# Patient Record
Sex: Female | Born: 1950 | Race: White | Hispanic: No | Marital: Single | Smoking: Never smoker
Health system: Southern US, Community
[De-identification: ages and names within clinical notes are randomized; demographics above are authoritative.]

## PROBLEM LIST (undated history)

## (undated) DIAGNOSIS — I10 Essential (primary) hypertension: Secondary | ICD-10-CM

## (undated) DIAGNOSIS — E669 Obesity, unspecified: Secondary | ICD-10-CM

## (undated) DIAGNOSIS — H18459 Nodular corneal degeneration, unspecified eye: Secondary | ICD-10-CM

## (undated) DIAGNOSIS — E559 Vitamin D deficiency, unspecified: Secondary | ICD-10-CM

## (undated) DIAGNOSIS — C4490 Unspecified malignant neoplasm of skin, unspecified: Secondary | ICD-10-CM

## (undated) DIAGNOSIS — E079 Disorder of thyroid, unspecified: Secondary | ICD-10-CM

## (undated) HISTORY — DX: Essential (primary) hypertension: I10

## (undated) HISTORY — DX: Vitamin D deficiency, unspecified: E55.9

## (undated) HISTORY — DX: Disorder of thyroid, unspecified: E07.9

## (undated) HISTORY — PX: TUBAL LIGATION: SHX77

## (undated) HISTORY — DX: Unspecified malignant neoplasm of skin, unspecified: C44.90

## (undated) HISTORY — PX: COLONOSCOPY: SHX174

## (undated) HISTORY — DX: Obesity, unspecified: E66.9

## (undated) HISTORY — PX: ABDOMINAL HYSTERECTOMY: SHX81

## (undated) HISTORY — PX: BREAST SURGERY: SHX581

## (undated) HISTORY — DX: Nodular corneal degeneration, unspecified eye: H18.459

---
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
## 2008-05-06 ENCOUNTER — Ambulatory Visit: Payer: Self-pay

## 2010-03-23 LAB — CBC
WBC: 7.3
platelet count: 302

## 2010-03-23 LAB — LIPID PANEL
Cholesterol: 209 mg/dL — AB (ref 0–200)
Triglycerides: 111

## 2010-03-23 LAB — COMPREHENSIVE METABOLIC PANEL
ALT: 32 U/L (ref 7–35)
AST: 26 U/L
GGT: 40 U/L (ref 10–52)
Iron: 107
Total Bilirubin: 0.4 mg/dL

## 2010-03-23 LAB — TSH: TSH: 2.59

## 2011-11-18 ENCOUNTER — Ambulatory Visit: Payer: Self-pay | Admitting: Family Medicine

## 2011-12-20 ENCOUNTER — Telehealth: Payer: Self-pay | Admitting: Family Medicine

## 2011-12-20 NOTE — Telephone Encounter (Signed)
Ok thanks 

## 2011-12-20 NOTE — Telephone Encounter (Signed)
Pt was calling concerning a boil on her neck that is over a cyst that was already there. She was wanting to know if she could get in sooner than 12/30/11 because she was extremely concerned about this. I was wondering if this Thursday at 11:15 was ok ??? She was extremely appreciative and thankful for anything we could do.   Thanks Dr. Reece Agar

## 2011-12-22 ENCOUNTER — Ambulatory Visit (INDEPENDENT_AMBULATORY_CARE_PROVIDER_SITE_OTHER): Payer: PRIVATE HEALTH INSURANCE | Admitting: Family Medicine

## 2011-12-22 ENCOUNTER — Encounter: Payer: Self-pay | Admitting: Family Medicine

## 2011-12-22 VITALS — BP 138/90 | HR 80 | Temp 98.3°F | Ht 66.0 in | Wt 193.5 lb

## 2011-12-22 DIAGNOSIS — L723 Sebaceous cyst: Secondary | ICD-10-CM

## 2011-12-22 DIAGNOSIS — L089 Local infection of the skin and subcutaneous tissue, unspecified: Secondary | ICD-10-CM

## 2011-12-22 DIAGNOSIS — E669 Obesity, unspecified: Secondary | ICD-10-CM | POA: Insufficient documentation

## 2011-12-22 DIAGNOSIS — L720 Epidermal cyst: Secondary | ICD-10-CM | POA: Insufficient documentation

## 2011-12-22 NOTE — Assessment & Plan Note (Signed)
Body mass index is 31.23 kg/(m^2).

## 2011-12-22 NOTE — Patient Instructions (Addendum)
Call us with name of prior doctor (2004) so we may get records. Continue warm compresses. Pass by [facility]'s office for referral to ENT. Script for [facility] labs provided today. Good to meet you today, call us with questions. Return next week for physical.

## 2011-12-22 NOTE — Assessment & Plan Note (Signed)
Given location, hesitant to perform I&D to remove cyst, however I do think this may need to be removed. Will refer to ENT for evaluation and benefit of removal of cyst. Continue bactrim, continue warm compresses.

## 2011-12-22 NOTE — Progress Notes (Signed)
Subjective:    Patient ID: Ashley Boyd, female    DOB: [birth year], 61 y.o.   MRN: [id number]  HPI CC: new pt to establsih  Ashley Boyd asked to be seen as a new patient sooner 2/2 recent boil over previous cyst R lateral neck.  Prior saw a doctor at [facility], no recent physician visits since 2004.  Seen at [facility] Saturday evening for boil on right side of neck.  Placed on bactrim DS 1 pill bid.  Did not I&D.  This boil developed over previous small cyst.  Treated at home with warm compresses.  Finally yesterday started draining - yellowish brown drainage.  Redness and pain actually improved.  Sxs going on for 7 days now.  Has dermatologist in [place] who had been following cyst on lateral neck, who was hesitant to cut into cyst given location.  Taking probiotics while on abx.  Preventative: Unsure last CPE (2004) No recent well woman exam.  Does not have OBGYN. Thinks mammo 2008 - normal. Colonoscopy - possibly done 9 yrs ago.  Caffeine: soda 3x/wk Lives with mother Occupation: accounts Environmental health practitioner Edu: HS Activity: no regular activity Diet: good water, fruits/vegetables occasional  Medications and allergies reviewed and updated in chart.  Past histories reviewed and updated if relevant as below. There is no problem list on file for this patient.  Past Medical History  Diagnosis Date  . Obesity    Past Surgical History  Procedure Date  . Tonsillectomy 1964  . Partial hysterectomy 1990    ovaries remain, fibroid tumor  . Tubal ligation     bilateral   History  Substance Use Topics  . Smoking status: Never Smoker   . Smokeless tobacco: Never Used  . Alcohol Use: No   Family History  Problem Relation Age of Onset  . Cancer Father     lung, smoker  . Emphysema Father   . Hypertension Neg Hx   . Diabetes Neg Hx   . Coronary artery disease Neg Hx   . Stroke Neg Hx    No Known Allergies No current outpatient prescriptions on file prior to  visit.     Review of Systems  Constitutional: Negative for fever, chills, activity change, appetite change, fatigue and unexpected weight change.  HENT: Negative for hearing loss and neck pain.   Eyes: Negative for visual disturbance.  Respiratory: Positive for shortness of breath (with walking, wonders if too sedentary ). Negative for cough, chest tightness and wheezing.   Cardiovascular: Positive for leg swelling (mild left, h/o bad veins). Negative for chest pain and palpitations.  Gastrointestinal: Negative for nausea, vomiting, abdominal pain, diarrhea, constipation, blood in stool and abdominal distention.  Genitourinary: Negative for hematuria and difficulty urinating.  Musculoskeletal: Negative for myalgias and arthralgias.  Skin: Negative for rash.  Neurological: Negative for dizziness, seizures, syncope and headaches.  Hematological: Bruises/bleeds easily (not spontaneous).  Psychiatric/Behavioral: Negative for dysphoric mood. The patient is not nervous/anxious.        Objective:   Physical Exam  Nursing note and vitals reviewed. Constitutional: She is oriented to person, place, and time. She appears well-developed and well-nourished. No distress.  HENT:  Head: Normocephalic and atraumatic.  Right Ear: Hearing, tympanic membrane, external ear and ear canal normal.  Left Ear: Hearing, tympanic membrane, external ear and ear canal normal.  Nose: Nose normal.  Mouth/Throat: Oropharynx is clear and moist. No oropharyngeal exudate.  Eyes: Conjunctivae and EOM are normal. Pupils are equal, round, and reactive  to light. No scleral icterus.  Neck: Normal range of motion. Neck supple. Erythema (mild) present.         Right lateral neck with erythematous edematous epidermal cyst with some wall of cyst protruding out. Expression of wound with mild amt serosanguinous drainage.  Cardiovascular: Normal rate, regular rhythm, normal heart sounds and intact distal pulses.   No murmur  heard. Pulses:      Radial pulses are 2+ on the right side, and 2+ on the left side.  Pulmonary/Chest: Effort normal and breath sounds normal. No respiratory distress. She has no wheezes. She has no rales.  Abdominal: Soft. Bowel sounds are normal. She exhibits no distension and no mass. There is no tenderness. There is no rebound and no guarding.  Musculoskeletal: Normal range of motion. She exhibits no edema.  Lymphadenopathy:    She has no cervical adenopathy.  Neurological: She is alert and oriented to person, place, and time.       CN grossly intact, station and gait intact  Skin: Skin is warm and dry. No rash noted.  Psychiatric: She has a normal mood and affect. Her behavior is normal. Judgment and thought content normal.       Assessment & Plan:  Return for CPE at previously scheduled appt 8/30

## 2011-12-27 LAB — CBC: Hemoglobin: 13.4 g/dL (ref 12.0–16.0)

## 2011-12-27 LAB — BASIC METABOLIC PANEL
BUN: 14 mg/dL (ref 4–21)
Glucose: 85
Potassium: 4.7 mmol/L

## 2011-12-27 LAB — LIPID PANEL
Cholesterol, Total: 217
Direct LDL: 149
HDL: 47 mg/dL (ref 35–70)

## 2011-12-27 LAB — TSH: TSH: 7.19

## 2011-12-28 ENCOUNTER — Encounter: Payer: Self-pay | Admitting: Family Medicine

## 2011-12-30 ENCOUNTER — Encounter: Payer: PRIVATE HEALTH INSURANCE | Admitting: Family Medicine

## 2011-12-30 ENCOUNTER — Ambulatory Visit: Payer: Self-pay | Admitting: Family Medicine

## 2011-12-30 ENCOUNTER — Telehealth: Payer: Self-pay | Admitting: Family Medicine

## 2011-12-30 MED ORDER — SULFAMETHOXAZOLE-TMP DS 800-160 MG PO TABS: 1.0000 | ORAL_TABLET | Freq: Two times a day (BID) | ORAL | Status: DC

## 2011-12-30 NOTE — Telephone Encounter (Signed)
Will extend bactrim course another 5 days for total of 15days.  plz notify.  Also, did she see ENT and if so can we get copy of consult? Thanks.

## 2011-12-30 NOTE — Telephone Encounter (Signed)
Pt wants a refill of BACTRIM

## 2011-12-30 NOTE — Telephone Encounter (Signed)
Patient notified. She did see ENT. Records requested.

## 2011-12-30 NOTE — Telephone Encounter (Signed)
Spoke with patient. She is asking for more abx because she feels that wound is not where it should be in the healing process. No fever, but not quite healed yet. She has appt next week with you and would just like an extension on meds.

## 2011-12-30 NOTE — Telephone Encounter (Signed)
Pt would like a copy of recent labs (completed on Tuesday) sent to her.

## 2012-01-02 ENCOUNTER — Encounter: Payer: Self-pay | Admitting: Family Medicine

## 2012-01-02 DIAGNOSIS — E039 Hypothyroidism, unspecified: Secondary | ICD-10-CM | POA: Insufficient documentation

## 2012-01-03 ENCOUNTER — Encounter: Payer: Self-pay | Admitting: Family Medicine

## 2012-01-05 ENCOUNTER — Encounter: Payer: PRIVATE HEALTH INSURANCE | Admitting: Family Medicine

## 2012-01-09 ENCOUNTER — Encounter: Payer: Self-pay | Admitting: Family Medicine

## 2012-01-15 ENCOUNTER — Encounter: Payer: Self-pay | Admitting: Family Medicine

## 2012-01-19 ENCOUNTER — Encounter: Payer: PRIVATE HEALTH INSURANCE | Admitting: Family Medicine

## 2012-02-07 ENCOUNTER — Encounter: Payer: PRIVATE HEALTH INSURANCE | Admitting: Family Medicine

## 2012-04-05 ENCOUNTER — Ambulatory Visit (INDEPENDENT_AMBULATORY_CARE_PROVIDER_SITE_OTHER): Payer: PRIVATE HEALTH INSURANCE | Admitting: Family Medicine

## 2012-04-05 ENCOUNTER — Other Ambulatory Visit: Payer: Self-pay | Admitting: Family Medicine

## 2012-04-05 ENCOUNTER — Telehealth: Payer: Self-pay | Admitting: Family Medicine

## 2012-04-05 ENCOUNTER — Encounter: Payer: Self-pay | Admitting: Family Medicine

## 2012-04-05 VITALS — BP 160/100 | HR 89 | Temp 98.9°F | Wt 192.0 lb

## 2012-04-05 DIAGNOSIS — E039 Hypothyroidism, unspecified: Secondary | ICD-10-CM

## 2012-04-05 DIAGNOSIS — R109 Unspecified abdominal pain: Secondary | ICD-10-CM

## 2012-04-05 DIAGNOSIS — I10 Essential (primary) hypertension: Secondary | ICD-10-CM

## 2012-04-05 LAB — POCT URINALYSIS DIPSTICK
Bilirubin, UA: NEGATIVE
Glucose, UA: NEGATIVE
Leukocytes, UA: NEGATIVE
Nitrite, UA: NEGATIVE
Urobilinogen, UA: 0.2

## 2012-04-05 LAB — COMPREHENSIVE METABOLIC PANEL
ALT: 23 U/L (ref 0–35)
AST: 24 U/L (ref 0–37)
Alkaline Phosphatase: 55 U/L (ref 39–117)
Creatinine, Ser: 0.8 mg/dL (ref 0.4–1.2)
Sodium: 136 mEq/L (ref 135–145)
Total Bilirubin: 0.7 mg/dL (ref 0.3–1.2)
Total Protein: 7.9 g/dL (ref 6.0–8.3)

## 2012-04-05 LAB — POCT UA - MICROSCOPIC ONLY
Bacteria, U Microscopic: 0
WBC, Ur, HPF, POC: 0

## 2012-04-05 LAB — CBC WITH DIFFERENTIAL/PLATELET
Basophils Absolute: 0 10*3/uL (ref 0.0–0.1)
HCT: 45.4 % (ref 36.0–46.0)
Hemoglobin: 14.8 g/dL (ref 12.0–15.0)
Lymphs Abs: 2.6 10*3/uL (ref 0.7–4.0)
MCV: 91 fl (ref 78.0–100.0)
Monocytes Absolute: 1 10*3/uL (ref 0.1–1.0)
Neutro Abs: 11.2 10*3/uL — ABNORMAL HIGH (ref 1.4–7.7)
Platelets: 327 10*3/uL (ref 150.0–400.0)
RDW: 13.9 % (ref 11.5–14.6)

## 2012-04-05 LAB — T4, FREE: Free T4: 0.86 ng/dL (ref 0.60–1.60)

## 2012-04-05 LAB — TSH: TSH: 2.12 u[IU]/mL (ref 0.35–5.50)

## 2012-04-05 MED ORDER — AMOXICILLIN-POT CLAVULANATE 875-125 MG PO TABS: 1.0000 | ORAL_TABLET | Freq: Two times a day (BID) | ORAL | Status: AC

## 2012-04-05 NOTE — Telephone Encounter (Signed)
Patient Information:  Caller Name: Tishana  Phone: [phone]  Patient: Ashley Boyd, Ashley Boyd  Gender: Female  DOB: October 06, 1950  Age: 61 Years  PCP: Eustaquio Boyden [facility])   Symptoms  Reason For Call & Symptoms: Patient states she thinks she might be dehydrated.  "knot in her stomach " (pain) for the past week. Located middle of the abdomen. She states she started herbs three weeksa ago (weight loss- perfect body solution) . She is also under extreme stress at work.  Vomiting  x2  last night .  Diarrhea for x3 days but worse last night. Pain described as cramping. Now headache at temples and complains of her "teeth hurting"  Reviewed Health History In EMR: Yes  Reviewed Medications In EMR: Yes  Reviewed Allergies In EMR: Yes  Reviewed Surgeries / Procedures: No  Date of Onset of Symptoms: 03/29/2012  Treatments Tried: Gatoraide.  Treatments Tried Worked: No  Guideline(s) Used:  Abdominal Pain - Female  Disposition Per Guideline:   See Today in Office  Reason For Disposition Reached:   Moderate or mild pain that comes and goes (cramps) lasts > 24 hours  Advice Given:  Reassurance:  A mild stomachache can be caused by indigestion, gas pains or overeating. Sometimes a stomachache signals the onset of a vomiting illness due to a viral gastroenteritis ("stomach flu").  Here is some care advice that should help.  Rest:  Lie down and rest until you feel better.  Fluids:  Sip clear fluids only (e.g., water, flat soft drinks or 1/2 strength fruit juice) until the pain has been gone for over 2 hours. Then slowly return to a regular diet.  Diet:  Slowly advance diet from clear liquids to a bland diet  Avoid alcohol or caffeinated beverages  Avoid greasy or fatty foods.  Office Follow Up:  Does the office need to follow up with this patient?: Yes  Instructions For The Office: Patient needs to be seen . Appt are book except for 11:30 "physical" with Dr. Reece Agar. please contact  patient. She does not want to be seen at another office. Can she be worked in?  Charity fundraiser Note:  Stop Herb OTC product and bring to the office with you for appt.

## 2012-04-05 NOTE — Assessment & Plan Note (Signed)
Check tsh at [facility].  script provided

## 2012-04-05 NOTE — Assessment & Plan Note (Addendum)
Afebrile.  Hypertensive - see below. Anticipate infectious colitis vs diverticulitis.  Given short duration continue to monitor, discussed bowel rest and pushing small sips throughout the day to ensure staying hydrated. If any worsening, to seek UCC.  If not improving by tomorrow, call me for CT scan to eval for diverticulitis given pain by story localized to LLQ, however on exam bilateral lower quadrants.  Overall benign abdominal exam. Checked UA today.  Sent her to [facility] with script. Pt declines nausea med. UA checked today - +RBC but micro reassuringly normal.  Consider recheck UA at CPE.

## 2012-04-05 NOTE — Patient Instructions (Addendum)
Return in the next month for a complete physical. I anticipate either colitis or diverticulitis.  Rest the bowel and push small sips throughout the day to ensure staying hydrated.  Clear liquid diet for next 1-2 days (chicken broth, gingerale, gatorade, jello). If any worsening, please seek urgent care Call me tomorrow with an update. Get blood work done at [facility] today.

## 2012-04-05 NOTE — Assessment & Plan Note (Signed)
Check Cr, TSH today. Consider EKG next visit. BP Readings from Last 3 Encounters:  04/05/12 160/100  12/22/11 138/90  bp elevation today could be related to abd pain.  Asked her to monitor at home, avoid salt, and will recheck at CPE.

## 2012-04-05 NOTE — Telephone Encounter (Signed)
I called pt and scheduled with Dr Reece Agar today at 11:30 am.

## 2012-04-05 NOTE — Progress Notes (Signed)
  Subjective:    Patient ID: Ashley Boyd, female    DOB: 21-Sep-1950, 61 y.o.   MRN: [id number]  HPI CC: not feeling well.  Never returned to establish or for physical.  1 wk h/o abd pain periumbilical, last night worse pain.  This morning having pain more on LLQ.  + vomiting x2.  Diarrhea last 2 days now resolved.  Headache present wonders if attributable to HTN.  Thinks staying hydrated.  Appetite staying decreased.    No fevers/chills.  No blood in stool.  No dysuria, urgency.  Did go to [place] prior to feeling ill.  No recent travel.  No sick contacts at home. Under a lot of stress at work.  HTN - elevated today.  Denies chest pain or tightness, SOB, leg swelling.  HA started this morning.  Hasn't tried anything for this yet.  Recently had dental work done BP Readings from Last 3 Encounters:  04/05/12 160/100  12/22/11 138/90   For last month has been taking perfect body solutions supplement - bee pollen, chinese yam, barbary wolfberry fruit, lotus seed and fiber and various other herbal remedies.  Thinks had colonoscopy 09-14-2002 ([place]) no records available but was not told had any issues with diverticulosis.  Per prior PCP note, normal colonoscopy  Past Medical History  Diagnosis Date  . Obesity   . Hypothyroidism      Review of Systems Per HPI    Objective:   Physical Exam  Nursing note and vitals reviewed. Constitutional: She appears well-developed and well-nourished. No distress.  HENT:  Head: Normocephalic and atraumatic.  Mouth/Throat: Oropharynx is clear and moist. No oropharyngeal exudate.  Eyes: Conjunctivae normal and EOM are normal. Pupils are equal, round, and reactive to light.  Cardiovascular: Normal rate, regular rhythm, normal heart sounds and intact distal pulses.   No murmur heard. Pulmonary/Chest: Effort normal and breath sounds normal. No respiratory distress. She has no wheezes. She has no rales.  Abdominal: Soft. Bowel sounds  are normal. She exhibits no distension and no mass. There is no hepatosplenomegaly. There is tenderness (jmild to deep palpation) in the right lower quadrant and left lower quadrant. There is no rigidity, no rebound, no guarding, no CVA tenderness and negative Murphy's sign.       Neg psoas test  Musculoskeletal: She exhibits no edema.  Skin: Skin is warm and dry. No rash noted.  Psychiatric: She has a normal mood and affect.       Assessment & Plan:

## 2012-04-06 ENCOUNTER — Telehealth: Payer: Self-pay | Admitting: Family Medicine

## 2012-04-06 NOTE — Telephone Encounter (Signed)
plz mail script to patient.  Placed in Kim's box. We have asked her to hold abx.

## 2012-04-06 NOTE — Telephone Encounter (Signed)
Called the patient to set up CT . She woke up today and has no pain no nausea and no vomiting. She wants to hold off on the CT Scan for now and per Dr G that is ok. She will take the anti biotic for the 10 days and will call to say how she is feeling. She also asked me to ask Dr Reece Agar to please write her a RX with all the labs he wants her to have at [facility] for her CPX here in January. She has to go there to have them drawn. She wanted to remind you to add the liver panel to the RX. Can you mail this to her so she wont have to come by to pick this up.

## 2012-04-06 NOTE — Telephone Encounter (Signed)
Lab order mailed to patient as requested.

## 2012-04-08 ENCOUNTER — Encounter: Payer: Self-pay | Admitting: Family Medicine

## 2012-04-30 LAB — LIPID PANEL
Direct LDL: 169
HDL: 49 mg/dL (ref 35–70)

## 2012-05-03 ENCOUNTER — Encounter: Payer: Self-pay | Admitting: Family Medicine

## 2012-05-07 ENCOUNTER — Ambulatory Visit (INDEPENDENT_AMBULATORY_CARE_PROVIDER_SITE_OTHER): Payer: PRIVATE HEALTH INSURANCE | Admitting: Family Medicine

## 2012-05-07 ENCOUNTER — Encounter: Payer: Self-pay | Admitting: Family Medicine

## 2012-05-07 VITALS — BP 146/86 | HR 86 | Temp 97.7°F | Wt 190.8 lb

## 2012-05-07 DIAGNOSIS — R109 Unspecified abdominal pain: Secondary | ICD-10-CM

## 2012-05-07 DIAGNOSIS — E559 Vitamin D deficiency, unspecified: Secondary | ICD-10-CM | POA: Insufficient documentation

## 2012-05-07 DIAGNOSIS — Z1211 Encounter for screening for malignant neoplasm of colon: Secondary | ICD-10-CM

## 2012-05-07 DIAGNOSIS — I10 Essential (primary) hypertension: Secondary | ICD-10-CM

## 2012-05-07 DIAGNOSIS — E039 Hypothyroidism, unspecified: Secondary | ICD-10-CM

## 2012-05-07 DIAGNOSIS — Z1231 Encounter for screening mammogram for malignant neoplasm of breast: Secondary | ICD-10-CM

## 2012-05-07 DIAGNOSIS — Z0000 Encounter for general adult medical examination without abnormal findings: Secondary | ICD-10-CM | POA: Insufficient documentation

## 2012-05-07 NOTE — Assessment & Plan Note (Signed)
Check vit D at [facility] given endorsed h/o low levels.

## 2012-05-07 NOTE — Assessment & Plan Note (Signed)
Preventative protocols reviewed and updated unless pt declined. Discussed healthy diet and lifestyle.  

## 2012-05-07 NOTE — Assessment & Plan Note (Signed)
Discussed reasons to treat elevated BP.  Discussed dietary changes to help control BP.  Remains hesitant for antihypertensives. BP Readings from Last 3 Encounters:  05/07/12 146/86  04/05/12 160/100  12/22/11 138/90

## 2012-05-07 NOTE — Patient Instructions (Signed)
Call your insurance about the shingles shot to see if it is covered or how much it would cost and where is cheaper (here or pharmacy).  If you want to receive here, call for nurse visit.  Stool kit today. Vit D check at [facility]. Pass by [facility]'s office today to schedule mammogram. Work on more potassium in diet, less sodium (salt) in diet, more water. Return as needed or in 1 year for next physical.

## 2012-05-07 NOTE — Assessment & Plan Note (Signed)
Resolved.  ?viral colitis. Discussed being seen for LLQ pain >2d

## 2012-05-07 NOTE — Progress Notes (Signed)
Subjective:    Patient ID: Ashley Boyd, female    DOB: [birth year], 62 y.o.   MRN: [id number]  HPI CC: annual exam  H/o low vit D - would like checked.  Trying to lose weight.  Using trampoline.  Watching diet. Wt Readings from Last 3 Encounters:  05/07/12 190 lb 12 oz (86.524 kg)  04/05/12 192 lb (87.091 kg)  12/22/11 193 lb 8 oz (87.771 kg)    HTN - brings log of bp showing 140-160s/70-90s.  Trying to watch diet.    Preventative:  Unsure last CPE (2004)  No recent well woman exam. Does not have OBGYN.  H/o hysterectomy for benign reasons.  No need for further pap smear. Thinks mammo 2008 - normal.  Would like [place]. Colonoscopy - 2005 WNL, will do stool kit today. Shingles - will check with insurance. Flu shot - done at [facility] 05/2012. Tetanus - done 2010  Medications and allergies reviewed and updated in chart.  Past histories reviewed and updated if relevant as below. Patient Active Problem List  Diagnosis  . Obesity  . Hypothyroidism  . Abdominal  pain, other specified site  . Hypertension   Past Medical History  Diagnosis Date  . Obesity   . Hypothyroidism   . Vitamin D deficiency    Past Surgical History  Procedure Date  . Tonsillectomy 1964  . Partial hysterectomy 1990    ovaries remain, for fibroid tumor  . Tubal ligation     bilateral  . Colonoscopy 01/2004    WNL per prior PCP note, no report   History  Substance Use Topics  . Smoking status: Never Smoker   . Smokeless tobacco: Never Used  . Alcohol Use: No   Family History  Problem Relation Age of Onset  . Cancer Father     lung, smoker  . Emphysema Father   . Hypertension Neg Hx   . Diabetes Neg Hx   . Coronary artery disease Neg Hx   . Stroke Neg Hx    No Known Allergies No current outpatient prescriptions on file prior to visit.      Review of Systems  Constitutional: Negative for fever, chills, activity change, appetite change, fatigue and unexpected weight change.    HENT: Negative for hearing loss and neck pain.   Eyes: Negative for visual disturbance.  Respiratory: Negative for cough, chest tightness, shortness of breath and wheezing.   Cardiovascular: Negative for chest pain, palpitations and leg swelling.  Gastrointestinal: Negative for nausea, vomiting, diarrhea, constipation, blood in stool and abdominal distention. Abdominal pain: resolved.  Genitourinary: Negative for hematuria and difficulty urinating.  Musculoskeletal: Negative for myalgias and arthralgias.  Skin: Negative for rash.  Neurological: Negative for dizziness, seizures, syncope and headaches.  Hematological: Does not bruise/bleed easily.  Psychiatric/Behavioral: Negative for dysphoric mood. The patient is not nervous/anxious.        Objective:   Physical Exam  Nursing note and vitals reviewed. Constitutional: She is oriented to person, place, and time. She appears well-developed and well-nourished. No distress.  HENT:  Head: Normocephalic and atraumatic.  Right Ear: Hearing, tympanic membrane, external ear and ear canal normal.  Left Ear: Hearing, tympanic membrane, external ear and ear canal normal.  Nose: Nose normal.  Mouth/Throat: Oropharynx is clear and moist. No oropharyngeal exudate.  Eyes: Conjunctivae normal and EOM are normal. Pupils are equal, round, and reactive to light. No scleral icterus.  Neck: Normal range of motion. Neck supple. Carotid bruit is not present. No thyromegaly present.  Cardiovascular: Normal rate, regular rhythm, normal heart sounds and intact distal pulses.   No murmur heard. Pulses:      Radial pulses are 2+ on the right side, and 2+ on the left side.  Pulmonary/Chest: Effort normal and breath sounds normal. No respiratory distress. She has no wheezes. She has no rales. Right breast exhibits no inverted nipple, no mass, no nipple discharge, no skin change and no tenderness. Left breast exhibits no inverted nipple, no mass, no nipple discharge,  no skin change and no tenderness. Breasts are symmetrical.  Abdominal: Soft. Bowel sounds are normal. She exhibits no distension and no mass. There is no tenderness. There is no rebound and no guarding.  Musculoskeletal: Normal range of motion. She exhibits no edema.  Lymphadenopathy:    She has no cervical adenopathy.    She has no axillary adenopathy.       Right axillary: No lateral adenopathy present.       Left axillary: No lateral adenopathy present.      Right: No supraclavicular adenopathy present.       Left: No supraclavicular adenopathy present.  Neurological: She is alert and oriented to person, place, and time.       CN grossly intact, station and gait intact  Skin: Skin is warm and dry. No rash noted.  Psychiatric: She has a normal mood and affect. Her behavior is normal. Judgment and thought content normal.       Assessment & Plan:

## 2012-05-07 NOTE — Assessment & Plan Note (Addendum)
Resolved on recheck. Continue to monitor.  

## 2012-05-08 ENCOUNTER — Other Ambulatory Visit: Payer: Self-pay | Admitting: Family Medicine

## 2012-05-08 ENCOUNTER — Encounter: Payer: Self-pay | Admitting: Family Medicine

## 2012-05-08 MED ORDER — CHOLECALCIFEROL 25 MCG (1000 UT) PO CAPS: 1000.0000 [IU] | ORAL_CAPSULE | Freq: Every day | ORAL | Status: DC

## 2012-05-09 ENCOUNTER — Encounter: Payer: Self-pay | Admitting: *Deleted

## 2012-05-17 ENCOUNTER — Ambulatory Visit: Payer: Self-pay | Admitting: Family Medicine

## 2012-05-17 ENCOUNTER — Encounter: Payer: Self-pay | Admitting: Family Medicine

## 2012-05-18 ENCOUNTER — Ambulatory Visit: Payer: Self-pay | Admitting: Family Medicine

## 2012-05-18 ENCOUNTER — Encounter: Payer: Self-pay | Admitting: Family Medicine

## 2012-05-18 ENCOUNTER — Telehealth: Payer: Self-pay | Admitting: Family Medicine

## 2012-05-18 NOTE — Telephone Encounter (Signed)
Spoke with patient.

## 2012-05-18 NOTE — Telephone Encounter (Signed)
Pt. Left a VM stating she was returning our call.  Please call pt at [phone]

## 2012-05-19 ENCOUNTER — Other Ambulatory Visit: Payer: Self-pay | Admitting: Family Medicine

## 2012-05-19 DIAGNOSIS — R928 Other abnormal and inconclusive findings on diagnostic imaging of breast: Secondary | ICD-10-CM

## 2012-05-22 ENCOUNTER — Ambulatory Visit: Payer: PRIVATE HEALTH INSURANCE

## 2012-05-23 ENCOUNTER — Ambulatory Visit (INDEPENDENT_AMBULATORY_CARE_PROVIDER_SITE_OTHER): Payer: PRIVATE HEALTH INSURANCE | Admitting: *Deleted

## 2012-05-23 DIAGNOSIS — Z23 Encounter for immunization: Secondary | ICD-10-CM

## 2012-05-23 DIAGNOSIS — Z2911 Encounter for prophylactic immunotherapy for respiratory syncytial virus (RSV): Secondary | ICD-10-CM

## 2014-09-25 ENCOUNTER — Telehealth: Payer: Self-pay | Admitting: *Deleted

## 2014-09-25 DIAGNOSIS — E559 Vitamin D deficiency, unspecified: Secondary | ICD-10-CM

## 2014-09-25 DIAGNOSIS — E669 Obesity, unspecified: Secondary | ICD-10-CM

## 2014-09-25 DIAGNOSIS — E039 Hypothyroidism, unspecified: Secondary | ICD-10-CM

## 2014-09-25 DIAGNOSIS — I10 Essential (primary) hypertension: Secondary | ICD-10-CM

## 2014-09-25 NOTE — Telephone Encounter (Signed)
Pt called the Triage line. She has a CPE appointment scheduled with Dr. Reece AgarG on 10/17/14, pt needs labs done at [facility] and is requesting a paper order to take to [facility] to have her labs done before her CPE

## 2014-09-26 NOTE — Telephone Encounter (Signed)
Orders mailed to pt's home address.

## 2014-09-26 NOTE — Telephone Encounter (Signed)
Written and in Kim's box. 

## 2014-09-26 NOTE — Addendum Note (Signed)
Addended by: Eustaquio BoydenGUTIERREZ, Sebrina Kessner on: 09/26/2014 07:23 AM   Modules accepted: Orders

## 2014-09-30 NOTE — Telephone Encounter (Signed)
Pt left v/m requesting status of [facility] order pt requested. Advised pt mailed on 09/26/14 and can take 4-7 days to receive; pt voiced understanding.

## 2014-10-17 ENCOUNTER — Encounter: Payer: PRIVATE HEALTH INSURANCE | Admitting: Family Medicine

## 2016-01-20 ENCOUNTER — Encounter: Payer: Self-pay | Admitting: Emergency Medicine

## 2016-01-20 ENCOUNTER — Emergency Department
Admission: EM | Admit: 2016-01-20 | Discharge: 2016-01-20 | Disposition: A | Discharge status: Home / Self Care | Payer: PRIVATE HEALTH INSURANCE | Attending: Emergency Medicine | Admitting: Emergency Medicine

## 2016-01-20 DIAGNOSIS — I10 Essential (primary) hypertension: Secondary | ICD-10-CM | POA: Insufficient documentation

## 2016-01-20 DIAGNOSIS — E039 Hypothyroidism, unspecified: Secondary | ICD-10-CM | POA: Diagnosis not present

## 2016-01-20 DIAGNOSIS — R51 Headache: Secondary | ICD-10-CM | POA: Diagnosis not present

## 2016-01-20 MED ORDER — AMLODIPINE BESYLATE 2.5 MG PO TABS: 2.5000 mg | ORAL_TABLET | Freq: Every day | ORAL | 0 refills | Status: DC

## 2016-01-20 NOTE — ED Triage Notes (Signed)
Pt presents to ED with reports of headache today and stopped at a pharmacy and took her BP. Pt states was told her systolic BP was 202. Pt was seen at [facility] today and had EKG done. Pt states [facility] told her to come here. Pt denies headache currently. Pt alert and oriented. Pt states she feels shaky on the inside.

## 2016-01-20 NOTE — Discharge Instructions (Signed)
Your blood pressure today is about 160/80. Have your blood pressure rechecked in one week. If it is still elevated above 140 systolic, start the amlodipine as prescribed until you can follow-up with primary care for reassessment.

## 2016-01-20 NOTE — ED Provider Notes (Signed)
[facility] Emergency Department Provider Note  ____________________________________________  Time seen: Approximately 6:31 PM  I have reviewed the triage vital signs and the nursing notes.   HISTORY  Chief Complaint Hypertension    HPI Ashley Boyd is a 65 y.o. female who complains of gradual onset headache that started about noon today. No trauma. No vision changes numbness tingling weakness vomiting or fever or neck stiffness. This was new for her she normally does not get severe headaches. Headache was bilateral frontal and throbbing. She checked her blood pressure and I automated machine and it was about 200 systolic. She went to urgent care who sent her here for evaluation.  No syncope. No chest pain shortness of breath or palpitations.   Past Medical History:  Diagnosis Date  . Hypothyroidism    currently stable off meds  . Obesity   . Vitamin D deficiency      Patient Active Problem List   Diagnosis Date Noted  . Healthcare maintenance 05/07/2012  . Vitamin D deficiency   . Abdominal pain, other specified site 04/05/2012  . Hypertension 04/05/2012  . Hypothyroidism   . Obesity      Past Surgical History:  Procedure Laterality Date  . COLONOSCOPY  01/2004   WNL per prior PCP note, no report  . PARTIAL HYSTERECTOMY  1990   ovaries remain, for fibroid tumor  . TONSILLECTOMY  1964  . TUBAL LIGATION     bilateral     Prior to Admission medications   Medication Sig Start Date End Date Taking? Authorizing Provider  amLODipine (NORVASC) 2.5 MG tablet Take 1 tablet (2.5 mg total) by mouth daily. 01/20/16 01/19/17  Sharman CheekPhillip Pearle Wandler, MD  Cholecalciferol ([employer] VITAMIN D3) 1000 UNITS capsule Take 1 capsule (1,000 Units total) by mouth daily. 05/08/12   Eustaquio BoydenJavier Gutierrez, MD     Allergies Review of patient's allergies indicates no known allergies.   Family History  Problem Relation Age of Onset  . Cancer Father     lung, smoker  .  Emphysema Father   . Hypertension Neg Hx   . Diabetes Neg Hx   . Coronary artery disease Neg Hx   . Stroke Neg Hx     Social History Social History  Substance Use Topics  . Smoking status: Never Smoker  . Smokeless tobacco: Never Used  . Alcohol use No    Review of Systems  Constitutional:   No fever or chills. No recent illness ENT:   No sore throat. No rhinorrhea. Cardiovascular:   No chest pain. Respiratory:   No dyspnea or cough. Gastrointestinal:   Negative for abdominal pain, vomiting and diarrhea.   Neurological:   Positive as above for headaches 10-point ROS otherwise negative.  ____________________________________________   PHYSICAL EXAM:  VITAL SIGNS: ED Triage Vitals  Enc Vitals Group     BP 01/20/16 1743 (!) 165/81     Pulse Rate 01/20/16 1743 93     Resp 01/20/16 1743 18     Temp 01/20/16 1743 98.5 F (36.9 C)     Temp Source 01/20/16 1743 Oral     SpO2 01/20/16 1743 94 %     Weight 01/20/16 1743 190 lb (86.2 kg)     Height 01/20/16 1743 5\' 6"  (1.676 m)     Head Circumference --      Peak Flow --      Pain Score 01/20/16 1744 2     Pain Loc --      Pain  Edu? --      Excl. in GC? --     Vital signs reviewed, nursing assessments reviewed.   Constitutional:   Alert and oriented. Well appearing and in no distress. Eyes:   No scleral icterus. No conjunctival pallor. PERRL. EOMI.  No nystagmus. ENT   Head:   Normocephalic and atraumatic.   Nose:   No congestion/rhinnorhea. No septal hematoma   Mouth/Throat:   MMM, no pharyngeal erythema. No peritonsillar mass.    Neck:   No stridor. No SubQ emphysema. No meningismus. Hematological/Lymphatic/Immunilogical:   No cervical lymphadenopathy. Cardiovascular:   RRR. Symmetric bilateral radial and DP pulses.  No murmurs.  Respiratory:   Normal respiratory effort without tachypnea nor retractions. Breath sounds are clear and equal bilaterally. No wheezes/rales/rhonchi. Gastrointestinal:   Soft  and nontender. Non distended. There is no CVA tenderness.  No rebound, rigidity, or guarding. Genitourinary:   deferred Musculoskeletal:   Nontender with normal range of motion in all extremities. No joint effusions.  No lower extremity tenderness.  No edema. Neurologic:   Normal speech and language.  CN 2-10 normal. Normal finger-nose-finger, no pronator drift Motor grossly intact. No gross focal neurologic deficits are appreciated.  Skin:    Skin is warm, dry and intact. No rash noted.  No petechiae, purpura, or bullae.  ____________________________________________    LABS (pertinent positives/negatives) (all labs ordered are listed, but only abnormal results are displayed) Labs Reviewed - No data to display ____________________________________________   EKG  Interpreted by me  Date: 01/20/2016  Rate: 88  Rhythm: normal sinus rhythm  QRS Axis: normal  Intervals: normal  ST/T Wave abnormalities: normal  Conduction Disutrbances: none  Narrative Interpretation: unremarkable      ____________________________________________    RADIOLOGY    ____________________________________________   PROCEDURES Procedures  ____________________________________________   INITIAL IMPRESSION / ASSESSMENT AND PLAN / ED COURSE  Pertinent labs & imaging results that were available during my care of the patient were reviewed by me and considered in my medical decision making (see chart for details).  Patient very well-appearing no acute distress smiling and conversant. Energetic.Considering the patient's symptoms, medical history, and physical examination today, I have low suspicion for ischemic stroke, intracranial hemorrhage, meningitis, encephalitis, carotid or vertebral dissection, venous sinus thrombosis, MS, intracranial hypertension, glaucoma, CRAO, CRVO, or temporal arteritis.  Patient's blood pressure today is stage II hypertension range. No clear etiology. Does not warrant  immediate antihypertensives. The patient is trying to establish herself with primary care but does not have a first appointment until about one month from now. To help facilitate and streamline her access to care, I have written her prescription for amlodipine today. I told her not to fill it for now, to recheck her blood pressure in one week, and if it remains elevated to 160 systolic then she should fill it and start the medication at that time.     Clinical Course   ____________________________________________   FINAL CLINICAL IMPRESSION(S) / ED DIAGNOSES  Final diagnoses:  Essential hypertension       Portions of this note were generated with dragon dictation software. Dictation errors may occur despite best attempts at proofreading.    Sharman Cheek, MD 01/20/16 [phone]

## 2016-02-08 ENCOUNTER — Ambulatory Visit: Payer: PRIVATE HEALTH INSURANCE | Admitting: Family Medicine

## 2016-02-16 ENCOUNTER — Ambulatory Visit (INDEPENDENT_AMBULATORY_CARE_PROVIDER_SITE_OTHER): Payer: PRIVATE HEALTH INSURANCE | Admitting: Family Medicine

## 2016-02-16 ENCOUNTER — Encounter: Payer: Self-pay | Admitting: Family Medicine

## 2016-02-16 VITALS — BP 158/96 | HR 68 | Temp 98.0°F | Ht 64.5 in | Wt 200.0 lb

## 2016-02-16 DIAGNOSIS — Z0000 Encounter for general adult medical examination without abnormal findings: Secondary | ICD-10-CM | POA: Diagnosis not present

## 2016-02-16 DIAGNOSIS — Z1231 Encounter for screening mammogram for malignant neoplasm of breast: Secondary | ICD-10-CM

## 2016-02-16 DIAGNOSIS — E785 Hyperlipidemia, unspecified: Secondary | ICD-10-CM | POA: Insufficient documentation

## 2016-02-16 DIAGNOSIS — Z1239 Encounter for other screening for malignant neoplasm of breast: Secondary | ICD-10-CM

## 2016-02-16 NOTE — Assessment & Plan Note (Signed)
Pap smear no longer needed. Mammogram ordered. In need of colonoscopy. Patient guarded and does not seem to be interested at this time. Declines tetanus, flu, and other immunizations at this time. Pneumococcal vaccine up-to-date. Screening labs today. BP elevated. Patient also does not want to discuss this and does not medication. Her home BP readings are also uncontrolled. Advised her to keep a long and inform me if her blood pressure is consistently elevated greater than 150/90.

## 2016-02-16 NOTE — Progress Notes (Signed)
Pre visit review using our clinic review tool, if applicable. No additional management support is needed unless otherwise documented below in the visit note. 

## 2016-02-16 NOTE — Patient Instructions (Signed)
Get your mammogram.  Consider Tetanus vaccine, colonoscopy.  Let me know if your BP stays >150/90.  Follow up in 6 months to 1 year   Take care  Dr. Adriana Simas   Health Maintenance, Female Adopting a healthy lifestyle and getting preventive care can go a long way to promote health and wellness. Talk with your health care provider about what schedule of regular examinations is right for you. This is a good chance for you to check in with your provider about disease prevention and staying healthy. In between checkups, there are plenty of things you can do on your own. Experts have done a lot of research about which lifestyle changes and preventive measures are most likely to keep you healthy. Ask your health care provider for more information. WEIGHT AND DIET  Eat a healthy diet  Be sure to include plenty of vegetables, fruits, low-fat dairy products, and lean protein.  Do not eat a lot of foods high in solid fats, added sugars, or salt.  Get regular exercise. This is one of the most important things you can do for your health.  Most adults should exercise for at least 150 minutes each week. The exercise should increase your heart rate and make you sweat (moderate-intensity exercise).  Most adults should also do strengthening exercises at least twice a week. This is in addition to the moderate-intensity exercise.  Maintain a healthy weight  Body mass index (BMI) is a measurement that can be used to identify possible weight problems. It estimates body fat based on height and weight. Your health care provider can help determine your BMI and help you achieve or maintain a healthy weight.  For females 48 years of age and older:   A BMI below 18.5 is considered underweight.  A BMI of 18.5 to 24.9 is normal.  A BMI of 25 to 29.9 is considered overweight.  A BMI of 30 and above is considered obese.  Watch levels of cholesterol and blood lipids  You should start having your blood tested  for lipids and cholesterol at 63 years of age, then have this test every 5 years.  You may need to have your cholesterol levels checked more often if:  Your lipid or cholesterol levels are high.  You are older than 63 years of age.  You are at high risk for heart disease.  CANCER SCREENING   Lung Cancer  Lung cancer screening is recommended for adults 77-49 years old who are at high risk for lung cancer because of a history of smoking.  A yearly low-dose CT scan of the lungs is recommended for people who:  Currently smoke.  Have quit within the past 15 years.  Have at least a 30-pack-year history of smoking. A pack year is smoking an average of one pack of cigarettes a day for 1 year.  Yearly screening should continue until it has been 15 years since you quit.  Yearly screening should stop if you develop a health problem that would prevent you from having lung cancer treatment.  Breast Cancer  Practice breast self-awareness. This means understanding how your breasts normally appear and feel.  It also means doing regular breast self-exams. Let your health care provider know about any changes, no matter how small.  If you are in your 20s or 30s, you should have a clinical breast exam (CBE) by a health care provider every 1-3 years as part of a regular health exam.  If you are 40 or older, have  a CBE every year. Also consider having a breast X-ray (mammogram) every year.  If you have a family history of breast cancer, talk to your health care provider about genetic screening.  If you are at high risk for breast cancer, talk to your health care provider about having an MRI and a mammogram every year.  Breast cancer gene (BRCA) assessment is recommended for women who have family members with BRCA-related cancers. BRCA-related cancers include:  Breast.  Ovarian.  Tubal.  Peritoneal cancers.  Results of the assessment will determine the need for genetic counseling and  BRCA1 and BRCA2 testing. Cervical Cancer Your health care provider may recommend that you be screened regularly for cancer of the pelvic organs (ovaries, uterus, and vagina). This screening involves a pelvic examination, including checking for microscopic changes to the surface of your cervix (Pap test). You may be encouraged to have this screening done every 3 years, beginning at age 29.  For women ages 11-65, health care providers may recommend pelvic exams and Pap testing every 3 years, or they may recommend the Pap and pelvic exam, combined with testing for human papilloma virus (HPV), every 5 years. Some types of HPV increase your risk of cervical cancer. Testing for HPV may also be done on women of any age with unclear Pap test results.  Other health care providers may not recommend any screening for nonpregnant women who are considered low risk for pelvic cancer and who do not have symptoms. Ask your health care provider if a screening pelvic exam is right for you.  If you have had past treatment for cervical cancer or a condition that could lead to cancer, you need Pap tests and screening for cancer for at least 20 years after your treatment. If Pap tests have been discontinued, your risk factors (such as having a new sexual partner) need to be reassessed to determine if screening should resume. Some women have medical problems that increase the chance of getting cervical cancer. In these cases, your health care provider may recommend more frequent screening and Pap tests. Colorectal Cancer  This type of cancer can be detected and often prevented.  Routine colorectal cancer screening usually begins at 63 years of age and continues through 63 years of age.  Your health care provider may recommend screening at an earlier age if you have risk factors for colon cancer.  Your health care provider may also recommend using home test kits to check for hidden blood in the stool.  A small camera at  the end of a tube can be used to examine your colon directly (sigmoidoscopy or colonoscopy). This is done to check for the earliest forms of colorectal cancer.  Routine screening usually begins at age 26.  Direct examination of the colon should be repeated every 5-10 years through 63 years of age. However, you may need to be screened more often if early forms of precancerous polyps or small growths are found. Skin Cancer  Check your skin from head to toe regularly.  Tell your health care provider about any new moles or changes in moles, especially if there is a change in a mole's shape or color.  Also tell your health care provider if you have a mole that is larger than the size of a pencil eraser.  Always use sunscreen. Apply sunscreen liberally and repeatedly throughout the day.  Protect yourself by wearing long sleeves, pants, a wide-brimmed hat, and sunglasses whenever you are outside. HEART DISEASE, DIABETES, AND HIGH  BLOOD PRESSURE   High blood pressure causes heart disease and increases the risk of stroke. High blood pressure is more likely to develop in:  People who have blood pressure in the high end of the normal range (130-139/85-89 mm Hg).  People who are overweight or obese.  People who are African American.  If you are 18-39 years of age, have your blood pressure checked every 3-5 years. If you are 40 years of age or older, have your blood pressure checked every year. You should have your blood pressure measured twice--once when you are at a hospital or clinic, and once when you are not at a hospital or clinic. Record the average of the two measurements. To check your blood pressure when you are not at a hospital or clinic, you can use:  An automated blood pressure machine at a pharmacy.  A home blood pressure monitor.  If you are between 55 years and 79 years old, ask your health care provider if you should take aspirin to prevent strokes.  Have regular diabetes  screenings. This involves taking a blood sample to check your fasting blood sugar level.  If you are at a normal weight and have a low risk for diabetes, have this test once every three years after 63 years of age.  If you are overweight and have a high risk for diabetes, consider being tested at a younger age or more often. PREVENTING INFECTION  Hepatitis B  If you have a higher risk for hepatitis B, you should be screened for this virus. You are considered at high risk for hepatitis B if:  You were born in a country where hepatitis B is common. Ask your health care provider which countries are considered high risk.  Your parents were born in a high-risk country, and you have not been immunized against hepatitis B (hepatitis B vaccine).  You have HIV or AIDS.  You use needles to inject street drugs.  You live with someone who has hepatitis B.  You have had sex with someone who has hepatitis B.  You get hemodialysis treatment.  You take certain medicines for conditions, including cancer, organ transplantation, and autoimmune conditions. Hepatitis C  Blood testing is recommended for:  Everyone born from 1945 through 1965.  Anyone with known risk factors for hepatitis C. Sexually transmitted infections (STIs)  You should be screened for sexually transmitted infections (STIs) including gonorrhea and chlamydia if:  You are sexually active and are younger than 63 years of age.  You are older than 63 years of age and your health care provider tells you that you are at risk for this type of infection.  Your sexual activity has changed since you were last screened and you are at an increased risk for chlamydia or gonorrhea. Ask your health care provider if you are at risk.  If you do not have HIV, but are at risk, it may be recommended that you take a prescription medicine daily to prevent HIV infection. This is called pre-exposure prophylaxis (PrEP). You are considered at risk  if:  You are sexually active and do not regularly use condoms or know the HIV status of your partner(s).  You take drugs by injection.  You are sexually active with a partner who has HIV. Talk with your health care provider about whether you are at high risk of being infected with HIV. If you choose to begin PrEP, you should first be tested for HIV. You should then be tested every   3 months for as long as you are taking PrEP.  PREGNANCY   If you are premenopausal and you may become pregnant, ask your health care provider about preconception counseling.  If you may become pregnant, take 400 to 800 micrograms (mcg) of folic acid every day.  If you want to prevent pregnancy, talk to your health care provider about birth control (contraception). OSTEOPOROSIS AND MENOPAUSE   Osteoporosis is a disease in which the bones lose minerals and strength with aging. This can result in serious bone fractures. Your risk for osteoporosis can be identified using a bone density scan.  If you are 55 years of age or older, or if you are at risk for osteoporosis and fractures, ask your health care provider if you should be screened.  Ask your health care provider whether you should take a calcium or vitamin D supplement to lower your risk for osteoporosis.  Menopause may have certain physical symptoms and risks.  Hormone replacement therapy may reduce some of these symptoms and risks. Talk to your health care provider about whether hormone replacement therapy is right for you.  HOME CARE INSTRUCTIONS   Schedule regular health, dental, and eye exams.  Stay current with your immunizations.   Do not use any tobacco products including cigarettes, chewing tobacco, or electronic cigarettes.  If you are pregnant, do not drink alcohol.  If you are breastfeeding, limit how much and how often you drink alcohol.  Limit alcohol intake to no more than 1 drink per day for nonpregnant women. One drink equals 12  ounces of beer, 5 ounces of wine, or 1 ounces of hard liquor.  Do not use street drugs.  Do not share needles.  Ask your health care provider for help if you need support or information about quitting drugs.  Tell your health care provider if you often feel depressed.  Tell your health care provider if you have ever been abused or do not feel safe at home.   This information is not intended to replace advice given to you by your health care provider. Make sure you discuss any questions you have with your health care provider.   Document Released: 11/01/2010 Document Revised: 05/09/2014 Document Reviewed: 03/20/2013 Elsevier Interactive Patient Education [employer].

## 2016-02-16 NOTE — Progress Notes (Signed)
Subjective:  Patient ID: Ashley Boyd, female    DOB: [birth year]  Age: 65 y.o. MRN: [id number]  CC: Establish care/Annual physical.  HPI Ashley Boyd is a 65 y.o. female presents to the clinic today to establish care. She desires a physical exam.  Preventative Healthcare  Pap smear: No longer indicated (s/p hysterectomy for fibroid uterus).  Mammogram: In need of. Will order.  Colonoscopy: In need of.  Immunizations  Tetanus - Declines.  Pneumococcal - 2017.  Flu - Declines.   Zoster - In need of.  Labs: Screening labs (sending to [facility] due to employer).  Exercise: No regular exercise.  Alcohol use: No.  Smoking/tobacco use: No.   PMH, Surgical Hx, Family Hx, Social History reviewed and updated as below.  Past Medical History:  Diagnosis Date  . Hypertension   . Obesity   . Vitamin D deficiency    Past Surgical History:  Procedure Laterality Date  . ABDOMINAL HYSTERECTOMY    . BREAST SURGERY    . COLONOSCOPY    . TONSILLECTOMY  1964  . TUBAL LIGATION     bilateral   Family History  Problem Relation Age of Onset  . Emphysema Father   . Lung cancer Father   . Hypertension Mother   . Kidney disease Mother   . Diabetes Neg Hx   . Coronary artery disease Neg Hx   . Stroke Neg Hx    Social History  Substance Use Topics  . Smoking status: Never Smoker  . Smokeless tobacco: Never Used  . Alcohol use No    Review of Systems  HENT: Positive for tinnitus.   Psychiatric/Behavioral:       Stress.  All other systems reviewed and are negative.   Objective:   Today's Vitals: BP (!) 158/96 (BP Location: Left Arm, Patient Position: Sitting, Cuff Size: Large)   Pulse 68   Temp 98 F (36.7 C) (Oral)   Ht 5' 4.5" (1.638 m)   Wt 200 lb (90.7 kg)   SpO2 98%   BMI 33.80 kg/m   Physical Exam  Constitutional: She is oriented to person, place, and time. She appears well-developed and well-nourished. No distress.  HENT:  Head:  Normocephalic and atraumatic.  Nose: Nose normal.  Mouth/Throat: Oropharynx is clear and moist. No oropharyngeal exudate.  Normal TM's bilaterally.   Eyes: Conjunctivae are normal. No scleral icterus.  Neck: Neck supple. No thyromegaly present.  Cardiovascular: Normal rate and regular rhythm.   No murmur heard. Pulmonary/Chest: Effort normal and breath sounds normal. She has no wheezes. She has no rales.  Abdominal: Soft. She exhibits no distension. There is no tenderness. There is no rebound and no guarding.  Musculoskeletal: Normal range of motion. She exhibits no edema.  Lymphadenopathy:    She has no cervical adenopathy.  Neurological: She is alert and oriented to person, place, and time.  Skin: Skin is warm and dry. No rash noted.  Psychiatric:  Guarded. Gives little information.  Vitals reviewed.  Assessment & Plan:   Problem List Items Addressed This Visit    Well woman exam without gynecological exam    Pap smear no longer needed. Mammogram ordered. In need of colonoscopy. Patient guarded and does not seem to be interested at this time. Declines tetanus, flu, and other immunizations at this time. Pneumococcal vaccine up-to-date. Screening labs today. BP elevated. Patient also does not want to discuss this and does not medication. Her home BP readings are also uncontrolled. Advised her to  keep a long and inform me if her blood pressure is consistently elevated greater than 150/90.        Other Visit Diagnoses    Screening for breast cancer       Relevant Orders   MM DIGITAL SCREENING BILATERAL      Outpatient Encounter Prescriptions as of 02/16/2016  Medication Sig  . [DISCONTINUED] amLODipine (NORVASC) 2.5 MG tablet Take 1 tablet (2.5 mg total) by mouth daily.  . [DISCONTINUED] Cholecalciferol ([facility] VITAMIN D3) 1000 UNITS capsule Take 1 capsule (1,000 Units total) by mouth daily.   No facility-administered encounter medications on file as of 02/16/2016.      Follow-up: 6 months to 1 year.  Everlene OtherJayce Burlin Mcnair DO [facility] [place]

## 2016-02-18 ENCOUNTER — Encounter: Payer: Self-pay | Admitting: Family Medicine

## 2016-02-18 ENCOUNTER — Telehealth: Payer: Self-pay | Admitting: Family Medicine

## 2016-02-18 NOTE — Addendum Note (Signed)
Addended by: Jennelle HumanNEWTON, ASHLEIGH E on: 02/18/2016 03:19 PM   Modules accepted: Orders

## 2016-02-18 NOTE — Telephone Encounter (Signed)
Ok. Thank you Pt is scheduled.  °

## 2016-02-18 NOTE — Telephone Encounter (Signed)
Ashley Boyd is needing pt to have a diagnostic bilaterl and US of left and right breast. Need order please and Thank you!

## 2016-02-18 NOTE — Telephone Encounter (Signed)
Orders placed.

## 2016-02-19 ENCOUNTER — Telehealth: Payer: Self-pay | Admitting: Family Medicine

## 2016-02-19 NOTE — Telephone Encounter (Signed)
Please advise 

## 2016-02-19 NOTE — Telephone Encounter (Signed)
Pt called wanting to get a colonoscopy done. Referral needed. Pt is requesting first avail or last avail if possible. Thank you!

## 2016-02-20 ENCOUNTER — Other Ambulatory Visit: Payer: Self-pay | Admitting: Family Medicine

## 2016-02-20 DIAGNOSIS — Z1211 Encounter for screening for malignant neoplasm of colon: Secondary | ICD-10-CM

## 2016-03-14 ENCOUNTER — Other Ambulatory Visit: Payer: PRIVATE HEALTH INSURANCE

## 2016-03-14 ENCOUNTER — Ambulatory Visit: Payer: PRIVATE HEALTH INSURANCE

## 2016-03-15 ENCOUNTER — Other Ambulatory Visit: Payer: Self-pay | Admitting: Family Medicine

## 2016-03-16 ENCOUNTER — Other Ambulatory Visit: Payer: PRIVATE HEALTH INSURANCE

## 2016-03-16 ENCOUNTER — Encounter: Payer: Self-pay | Admitting: Family Medicine

## 2016-03-16 ENCOUNTER — Ambulatory Visit: Payer: PRIVATE HEALTH INSURANCE

## 2016-03-16 LAB — LIPID PANEL W/O CHOL/HDL RATIO
CHOLESTEROL TOTAL: 211 mg/dL — AB (ref 100–199)
HDL: 49 mg/dL (ref >39)
LDL Calculated: 137 mg/dL — ABNORMAL HIGH (ref 0–99)
Triglycerides: 127 mg/dL (ref 0–149)
VLDL Cholesterol Cal: 25 mg/dL (ref 5–40)

## 2016-03-16 LAB — COMPREHENSIVE METABOLIC PANEL
A/G RATIO: 1.6 (ref 1.2–2.2)
ALBUMIN: 4.4 g/dL (ref 3.6–4.8)
ALT: 21 IU/L (ref 0–32)
AST: 18 IU/L (ref 0–40)
Alkaline Phosphatase: 53 IU/L (ref 39–117)
BUN / CREAT RATIO: 15 (ref 12–28)
BUN: 15 mg/dL (ref 8–27)
Bilirubin Total: 0.5 mg/dL (ref 0.0–1.2)
CALCIUM: 9.7 mg/dL (ref 8.7–10.3)
CO2: 26 mmol/L (ref 18–29)
CREATININE: 0.97 mg/dL (ref 0.57–1.00)
Chloride: 100 mmol/L (ref 96–106)
GFR calc Af Amer: 72 mL/min/{1.73_m2} (ref >59)
GFR, EST NON AFRICAN AMERICAN: 62 mL/min/{1.73_m2} (ref >59)
GLOBULIN, TOTAL: 2.8 g/dL (ref 1.5–4.5)
Glucose: 97 mg/dL (ref 65–99)
POTASSIUM: 4.7 mmol/L (ref 3.5–5.2)
SODIUM: 142 mmol/L (ref 134–144)
Total Protein: 7.2 g/dL (ref 6.0–8.5)

## 2016-03-16 LAB — VITAMIN D 25 HYDROXY (VIT D DEFICIENCY, FRACTURES): VIT D 25 HYDROXY: 20.9 ng/mL — AB (ref 30.0–100.0)

## 2016-03-16 LAB — CBC
Hematocrit: 40.2 % (ref 34.0–46.6)
Hemoglobin: 13.3 g/dL (ref 11.1–15.9)
MCH: 29.4 pg (ref 26.6–33.0)
MCHC: 33.1 g/dL (ref 31.5–35.7)
MCV: 89 fL (ref 79–97)
PLATELETS: 338 10*3/uL (ref 150–379)
RBC: 4.53 x10E6/uL (ref 3.77–5.28)
RDW: 13.1 % (ref 12.3–15.4)
WBC: 6.2 10*3/uL (ref 3.4–10.8)

## 2016-03-16 LAB — HGB A1C W/O EAG: HEMOGLOBIN A1C: 5.8 % — AB (ref 4.8–5.6)

## 2016-03-17 ENCOUNTER — Encounter: Payer: Self-pay | Admitting: Family Medicine

## 2016-03-18 ENCOUNTER — Ambulatory Visit: Payer: PRIVATE HEALTH INSURANCE

## 2016-03-18 VITALS — Ht 65.0 in | Wt 196.0 lb

## 2016-03-18 DIAGNOSIS — Z0000 Encounter for general adult medical examination without abnormal findings: Secondary | ICD-10-CM

## 2016-03-18 NOTE — Progress Notes (Signed)
Patient comes in requesting weight check and height check per her request.  Patient advised these recording were accurate with what  we had in our records.  Please advise if there will be a charge.

## 2017-04-03 ENCOUNTER — Telehealth: Payer: Self-pay | Admitting: Gastroenterology

## 2017-04-03 NOTE — Telephone Encounter (Signed)
Patient r/c to set up colonoscopy.

## 2017-04-05 ENCOUNTER — Other Ambulatory Visit: Payer: Self-pay

## 2017-04-05 ENCOUNTER — Telehealth: Payer: Self-pay

## 2017-04-05 DIAGNOSIS — Z1211 Encounter for screening for malignant neoplasm of colon: Secondary | ICD-10-CM

## 2017-04-05 NOTE — Telephone Encounter (Signed)
Gastroenterology Pre-Procedure Review  Request Date: 05/09/17 Requesting Physician: Dr. Servando SnareWohl  PATIENT REVIEW QUESTIONS: The patient responded to the following health history questions as indicated:    1. Are you having any GI issues? no 2. Do you have a personal history of Polyps? no 3. Do you have a family history of Colon Cancer or Polyps? no 4. Diabetes Mellitus? no 5. Joint replacements in the past 12 months?no 6. Major health problems in the past 3 months?no 7. Any artificial heart valves, MVP, or defibrillator?no    MEDICATIONS & ALLERGIES:    Patient reports the following regarding taking any anticoagulation/antiplatelet therapy:   Plavix, Coumadin, Eliquis, Xarelto, Lovenox, Pradaxa, Brilinta, or Effient? no Aspirin? no  Patient confirms/reports the following medications:  No current outpatient medications on file.   No current facility-administered medications for this visit.     Patient confirms/reports the following allergies:  No Known Allergies  No orders of the defined types were placed in this encounter.   AUTHORIZATION INFORMATION Primary Insurance: 1D#: Group #:  Secondary Insurance: 1D#: Group #:  SCHEDULE INFORMATION: Date: 05/19/17 Time: Location: [facility]

## 2017-04-27 ENCOUNTER — Telehealth: Payer: Self-pay | Admitting: Gastroenterology

## 2017-04-27 NOTE — Telephone Encounter (Signed)
Patient left a voice message that she needs to reschedule her procedure that is on 1/7. Please call

## 2017-05-12 ENCOUNTER — Telehealth: Payer: Self-pay | Admitting: Gastroenterology

## 2017-05-12 NOTE — Telephone Encounter (Signed)
Patient needs to cancel procedure due to insurance problems. She will call back later to reschedule when her [employer] is in effect.

## 2017-05-16 ENCOUNTER — Encounter: Admission: RE | Payer: Self-pay | Source: Ambulatory Visit

## 2017-05-16 ENCOUNTER — Ambulatory Visit
Admission: RE | Admit: 2017-05-16 | Payer: BLUE CROSS/BLUE SHIELD | Source: Ambulatory Visit | Admitting: Gastroenterology

## 2017-05-16 SURGERY — COLONOSCOPY WITH PROPOFOL
Anesthesia: General

## 2017-10-18 DIAGNOSIS — Z872 Personal history of diseases of the skin and subcutaneous tissue: Secondary | ICD-10-CM | POA: Diagnosis not present

## 2017-10-18 DIAGNOSIS — L570 Actinic keratosis: Secondary | ICD-10-CM | POA: Diagnosis not present

## 2017-10-18 DIAGNOSIS — L818 Other specified disorders of pigmentation: Secondary | ICD-10-CM | POA: Diagnosis not present

## 2017-10-18 DIAGNOSIS — Z85828 Personal history of other malignant neoplasm of skin: Secondary | ICD-10-CM | POA: Diagnosis not present

## 2017-10-18 DIAGNOSIS — L578 Other skin changes due to chronic exposure to nonionizing radiation: Secondary | ICD-10-CM | POA: Diagnosis not present

## 2017-12-06 DIAGNOSIS — L570 Actinic keratosis: Secondary | ICD-10-CM | POA: Diagnosis not present

## 2017-12-11 ENCOUNTER — Other Ambulatory Visit: Payer: Self-pay

## 2017-12-11 DIAGNOSIS — Z1211 Encounter for screening for malignant neoplasm of colon: Secondary | ICD-10-CM

## 2017-12-20 ENCOUNTER — Other Ambulatory Visit: Payer: Self-pay | Admitting: Internal Medicine

## 2017-12-20 DIAGNOSIS — Z1231 Encounter for screening mammogram for malignant neoplasm of breast: Secondary | ICD-10-CM

## 2017-12-21 ENCOUNTER — Ambulatory Visit: Payer: Self-pay | Admitting: Podiatry

## 2017-12-27 ENCOUNTER — Ambulatory Visit: Payer: Medicare Other

## 2017-12-27 ENCOUNTER — Telehealth: Payer: Self-pay | Admitting: *Deleted

## 2017-12-27 ENCOUNTER — Encounter: Payer: Self-pay | Admitting: Podiatry

## 2017-12-27 ENCOUNTER — Ambulatory Visit: Payer: Medicare Other | Admitting: Podiatry

## 2017-12-27 VITALS — BP 127/63 | HR 63 | Resp 16

## 2017-12-27 DIAGNOSIS — M779 Enthesopathy, unspecified: Principal | ICD-10-CM

## 2017-12-27 DIAGNOSIS — M778 Other enthesopathies, not elsewhere classified: Secondary | ICD-10-CM

## 2017-12-27 NOTE — Progress Notes (Signed)
  Subjective:  Patient ID: Ashley Boyd, female    DOB: 1950-09-08,  MRN: [id number] HPI Chief Complaint  Patient presents with  . Foot Pain    Patient requesting a foot exam-wears orthotics made by chiropractor for plantar fasciitis  . New Patient (Initial Visit)    67 y.o. female presents with the above complaint.   ROS: Denies fever chills nausea vomiting muscle aches pains calf pain back pain chest pain shortness of breath.  Past Medical History:  Diagnosis Date  . Hypertension   . Obesity   . Vitamin D deficiency    Past Surgical History:  Procedure Laterality Date  . ABDOMINAL HYSTERECTOMY    . BREAST SURGERY    . COLONOSCOPY    . TONSILLECTOMY  1964  . TUBAL LIGATION     bilateral   No current outpatient medications on file.  No Known Allergies Review of Systems Objective:   Vitals:   12/27/17 1355  BP: 127/63  Pulse: 63  Resp: 16    General: Well developed, nourished, in no acute distress, alert and oriented x3   Dermatological: Skin is warm, dry and supple bilateral. Nails x 10 are well maintained; remaining integument appears unremarkable at this time. There are no open sores, no preulcerative lesions, no rash or signs of infection present.  Vascular: Dorsalis Pedis artery and Posterior Tibial artery pedal pulses are 2/4 bilateral with immedate capillary fill time. Pedal hair growth present. No varicosities and no lower extremity edema present bilateral.   Neruologic: Grossly intact via light touch bilateral. Vibratory intact via tuning fork bilateral. Protective threshold with Semmes Wienstein monofilament intact to all pedal sites bilateral. Patellar and Achilles deep tendon reflexes 2+ bilateral. No Babinski or clonus noted bilateral.   Musculoskeletal: No gross boney pedal deformities bilateral. No pain, crepitus, or limitation noted with foot and ankle range of motion bilateral. Muscular strength 5/5 in all groups tested bilateral.  Mild  tenderness on palpation and range of motion of the second metatarsal phalangeal joint.  Otherwise the foot appears to be rectus and in good position.  Gait: Unassisted, Nonantalgic.    Radiographs:  None taken declined  Assessment & Plan:   Assessment: Capsulitis most likely second metatarsophalangeal joint with some associated neuritis.  Plan: No treatment.  Discussed shoe gear in great detail.     Saud Bail T. ChesterHyatt, [place] Dakota[place]PM

## 2017-12-28 NOTE — Telephone Encounter (Signed)
-----   Message from Gregary CromerVicki L Hill sent at 12/28/2017 12:02 PM EDT ----- No charges are in for xrays so Not sure who needs to get this message for her after visit summary to be corrected

## 2017-12-28 NOTE — Telephone Encounter (Signed)
I informed pt, the accounts receivable agent states there were no imaging charges.

## 2017-12-28 NOTE — Telephone Encounter (Signed)
Pt states she was seen in the [place] office and made it very clear that it was for a routine foot exam, her After Visit Note had that imaging was to be ordered. Pt states no imaging was performed and she wants it billed that way, and there should be no other charge than just her co-pay.

## 2018-01-08 ENCOUNTER — Encounter: Payer: Self-pay | Admitting: *Deleted

## 2018-01-09 ENCOUNTER — Ambulatory Visit: Payer: Medicare Other | Admitting: Anesthesiology

## 2018-01-09 ENCOUNTER — Encounter
Admission: RE | Disposition: A | Discharge status: Home / Self Care | Payer: Self-pay | Source: Ambulatory Visit | Attending: Gastroenterology

## 2018-01-09 ENCOUNTER — Encounter: Payer: Self-pay | Admitting: Anesthesiology

## 2018-01-09 ENCOUNTER — Ambulatory Visit
Admission: RE | Admit: 2018-01-09 | Discharge: 2018-01-09 | Disposition: A | Discharge status: Home / Self Care | Payer: Medicare Other | Source: Ambulatory Visit | Attending: Gastroenterology | Admitting: Gastroenterology

## 2018-01-09 DIAGNOSIS — K5730 Diverticulosis of large intestine without perforation or abscess without bleeding: Secondary | ICD-10-CM | POA: Insufficient documentation

## 2018-01-09 DIAGNOSIS — Z1211 Encounter for screening for malignant neoplasm of colon: Secondary | ICD-10-CM | POA: Diagnosis not present

## 2018-01-09 DIAGNOSIS — E559 Vitamin D deficiency, unspecified: Secondary | ICD-10-CM | POA: Insufficient documentation

## 2018-01-09 DIAGNOSIS — Z79899 Other long term (current) drug therapy: Secondary | ICD-10-CM | POA: Diagnosis not present

## 2018-01-09 DIAGNOSIS — I10 Essential (primary) hypertension: Secondary | ICD-10-CM | POA: Diagnosis not present

## 2018-01-09 HISTORY — PX: COLONOSCOPY WITH PROPOFOL: SHX5780

## 2018-01-09 SURGERY — COLONOSCOPY WITH PROPOFOL
Anesthesia: General

## 2018-01-09 MED ORDER — FENTANYL CITRATE (PF) 100 MCG/2ML IJ SOLN: 25.0000 ug | INTRAMUSCULAR | Status: DC | PRN

## 2018-01-09 MED ORDER — SODIUM CHLORIDE 0.9 % IV SOLN
INTRAVENOUS | Status: DC
  Administered 2018-01-09: 1000 mL via INTRAVENOUS

## 2018-01-09 MED ORDER — ONDANSETRON HCL 4 MG/2ML IJ SOLN: 4.0000 mg | Freq: Once | INTRAMUSCULAR | Status: DC | PRN

## 2018-01-09 MED ORDER — PROPOFOL 500 MG/50ML IV EMUL
INTRAVENOUS | Status: DC | PRN
  Administered 2018-01-09: 130 ug/kg/min via INTRAVENOUS

## 2018-01-09 MED ORDER — LIDOCAINE HCL (PF) 1 % IJ SOLN
INTRAMUSCULAR | Status: AC
  Administered 2018-01-09: 0.3 mL via INTRADERMAL
  Filled 2018-01-09: qty 2

## 2018-01-09 MED ORDER — LIDOCAINE HCL (CARDIAC) PF 100 MG/5ML IV SOSY
PREFILLED_SYRINGE | INTRAVENOUS | Status: DC | PRN
  Administered 2018-01-09: 50 mg via INTRAVENOUS

## 2018-01-09 MED ORDER — PROPOFOL 10 MG/ML IV BOLUS
INTRAVENOUS | Status: DC | PRN
  Administered 2018-01-09: 70 mg via INTRAVENOUS

## 2018-01-09 MED ORDER — LIDOCAINE HCL (PF) 1 % IJ SOLN
2.0000 mL | Freq: Once | INTRAMUSCULAR | Status: AC
  Administered 2018-01-09: 0.3 mL via INTRADERMAL

## 2018-01-09 NOTE — Anesthesia Procedure Notes (Signed)
Performed by: Kearie Mennen, CRNA Pre-anesthesia Checklist: Patient identified, Emergency Drugs available, Suction available, Patient being monitored and Timeout performed Patient Re-evaluated:Patient Re-evaluated prior to induction Oxygen Delivery Method: Nasal cannula Induction Type: IV induction       

## 2018-01-09 NOTE — Anesthesia Preprocedure Evaluation (Addendum)
Anesthesia Evaluation  Patient identified by MRN, date of birth, ID band Patient awake    Reviewed: Allergy & Precautions, NPO status , Patient's Chart, lab work & pertinent test results  Airway Mallampati: III  TM Distance: <3 FB     Dental  (+) Chipped, Caps   Pulmonary neg pulmonary ROS,    Pulmonary exam normal        Cardiovascular hypertension, Normal cardiovascular exam     Neuro/Psych negative neurological ROS  negative psych ROS   GI/Hepatic negative GI ROS, Neg liver ROS,   Endo/Other  negative endocrine ROS  Renal/GU negative Renal ROS  negative genitourinary   Musculoskeletal negative musculoskeletal ROS (+)   Abdominal Normal abdominal exam  (+)   Peds negative pediatric ROS (+)  Hematology negative hematology ROS (+)   Anesthesia Other Findings   Reproductive/Obstetrics                            Anesthesia Physical Anesthesia Plan  ASA: II  Anesthesia Plan: General   Post-op Pain Management:    Induction: Intravenous  PONV Risk Score and Plan: Propofol infusion  Airway Management Planned: Nasal Cannula  Additional Equipment:   Intra-op Plan:   Post-operative Plan:   Informed Consent: I have reviewed the patients History and Physical, chart, labs and discussed the procedure including the risks, benefits and alternatives for the proposed anesthesia with the patient or authorized representative who has indicated his/her understanding and acceptance.   Dental advisory given  Plan Discussed with: CRNA and Surgeon  Anesthesia Plan Comments:         Anesthesia Quick Evaluation

## 2018-01-09 NOTE — Transfer of Care (Signed)
Immediate Anesthesia Transfer of Care Note  Patient: Ashley Boyd  Procedure(s) Performed: COLONOSCOPY WITH PROPOFOL (N/A )  Patient Location: PACU  Anesthesia Type:General  Level of Consciousness: awake and responds to stimulation  Airway & Oxygen Therapy: Patient Spontanous Breathing and Patient connected to nasal cannula oxygen  Post-op Assessment: Report given to RN and Post -op Vital signs reviewed and stable  Post vital signs: Reviewed and stable  Last Vitals:  Vitals Value Taken Time  BP 119/62 01/09/2018 11:07 AM  Temp    Pulse    Resp 13 01/09/2018 11:07 AM  SpO2 100 % 01/09/2018 11:07 AM    Last Pain:  Vitals:   01/09/18 1006  TempSrc: Tympanic  PainSc: 0-No pain         Complications: No apparent anesthesia complications

## 2018-01-09 NOTE — H&P (Signed)
Midge Minium, MD [facility] [address], [address] [place], [place] [address] Phone: [phone] Fax : [phone]  Primary Care Physician:  Corky Downs, MD Primary Gastroenterologist:  Dr. Servando Snare  Pre-Procedure History & Physical: HPI:  Astra Gregg is a 67 y.o. female is here for a screening colonoscopy.   Past Medical History:  Diagnosis Date  . Hypertension   . Obesity   . Vitamin D deficiency     Past Surgical History:  Procedure Laterality Date  . ABDOMINAL HYSTERECTOMY    . BREAST SURGERY    . COLONOSCOPY    . TONSILLECTOMY  1964  . TUBAL LIGATION     bilateral    Prior to Admission medications   Not on File    Allergies as of 12/11/2017  . (No Known Allergies)    Family History  Problem Relation Age of Onset  . Emphysema Father   . Lung cancer Father   . Hypertension Mother   . Kidney disease Mother   . Diabetes Neg Hx   . Coronary artery disease Neg Hx   . Stroke Neg Hx     Social History   Socioeconomic History  . Marital status: Divorced    Spouse name: Not on file  . Number of children: Not on file  . Years of education: Not on file  . Highest education level: Not on file  Occupational History  . Not on file  Social Needs  . Financial resource strain: Not on file  . Food insecurity:    Worry: Not on file    Inability: Not on file  . Transportation needs:    Medical: Not on file    Non-medical: Not on file  Tobacco Use  . Smoking status: Never Smoker  . Smokeless tobacco: Never Used  Substance and Sexual Activity  . Alcohol use: No  . Drug use: No  . Sexual activity: Never  Lifestyle  . Physical activity:    Days per week: Not on file    Minutes per session: Not on file  . Stress: Not on file  Relationships  . Social connections:    Talks on phone: Not on file    Gets together: Not on file    Attends religious service: Not on file    Active member of club or organization: Not on file    Attends meetings of clubs or  organizations: Not on file    Relationship status: Not on file  . Intimate partner violence:    Fear of current or ex partner: Not on file    Emotionally abused: Not on file    Physically abused: Not on file    Forced sexual activity: Not on file  Other Topics Concern  . Not on file  Social History Narrative   Caffeine: soda 3x/wk   Lives with mother, grown son   Occupation: accounts Environmental health practitioner   Edu: HS   Activity: no regular activity   Diet: good water, fruits/vegetables occasional    Review of Systems: See HPI, otherwise negative ROS  Physical Exam: BP (!) 153/69   Pulse (!) 56   Temp (!) 96.9 F (36.1 C) (Tympanic)   Resp 17   Ht 5\' 5"  (1.651 m)   Wt 72.6 kg   SpO2 100%   BMI 26.63 kg/m  General:   Alert,  pleasant and cooperative in NAD Head:  Normocephalic and atraumatic. Neck:  Supple; no masses or thyromegaly. Lungs:  Clear throughout to auscultation.  Heart:  Regular rate and rhythm. Abdomen:  Soft, nontender and nondistended. Normal bowel sounds, without guarding, and without rebound.   Neurologic:  Alert and  oriented x4;  grossly normal neurologically.  Impression/Plan: Icess Leaton is now here to undergo a screening colonoscopy.  Risks, benefits, and alternatives regarding colonoscopy have been reviewed with the patient.  Questions have been answered.  All parties agreeable.

## 2018-01-09 NOTE — Op Note (Signed)
[facility] Patient Name: Ashley Boyd Procedure Date: 01/09/2018 10:49 AM MRN: [id number] Account #: [id number] Date of Birth: 1950-11-25 Admit Type: Outpatient Age: 67 Room: [facility] ENDO ROOM 4 Gender: Female Note Status: Finalized Procedure:            Colonoscopy Indications:          Screening for colorectal malignant neoplasm Providers:            Midge Minium MD, MD Medicines:            Propofol per Anesthesia Complications:        No immediate complications. Procedure:            Pre-Anesthesia Assessment:                       - Prior to the procedure, a History and Physical was                        performed, and patient medications and allergies were                        reviewed. The patient's tolerance of previous                        anesthesia was also reviewed. The risks and benefits of                        the procedure and the sedation options and risks were                        discussed with the patient. All questions were                        answered, and informed consent was obtained. Prior                        Anticoagulants: The patient has taken no previous                        anticoagulant or antiplatelet agents. ASA Grade                        Assessment: II - A patient with mild systemic disease.                        After reviewing the risks and benefits, the patient was                        deemed in satisfactory condition to undergo the                        procedure.                       After obtaining informed consent, the colonoscope was                        passed under direct vision. Throughout the procedure,                        the patient's blood pressure,  pulse, and oxygen                        saturations were monitored continuously. The                        Colonoscope was introduced through the anus and                        advanced to the the cecum, identified by  appendiceal                        orifice and ileocecal valve. The colonoscopy was                        performed without difficulty. The patient tolerated the                        procedure well. The quality of the bowel preparation                        was excellent. Findings:      The perianal and digital rectal examinations were normal.      Multiple small-mouthed diverticula were found in the sigmoid colon. Impression:           - Diverticulosis in the sigmoid colon.                       - No specimens collected. Recommendation:       - Discharge patient to home.                       - Resume previous diet.                       - Continue present medications.                       - Repeat colonoscopy in 10 years for screening unless                        any change in family history or lower GI problems. Procedure Code(s):    --- Professional ---                       [phone], Colonoscopy, flexible; diagnostic, including                        collection of specimen(s) by brushing or washing, when                        performed (separate procedure) Diagnosis Code(s):    --- Professional ---                       Z12.11, Encounter for screening for malignant neoplasm                        of colon CPT copyright 2017 American Medical Association. All rights reserved. The codes documented in this report are preliminary and upon coder review may  be revised to meet current compliance requirements. Midge Minium MD, MD 01/09/2018 11:04:40 AM This report  has been signed electronically. Number of Addenda: 0 Note Initiated On: 01/09/2018 10:49 AM Scope Withdrawal Time: 0 hours 6 minutes 46 seconds  Total Procedure Duration: 0 hours 9 minutes 20 seconds       [facility]

## 2018-01-09 NOTE — Anesthesia Post-op Follow-up Note (Signed)
Anesthesia QCDR form completed.        

## 2018-01-10 ENCOUNTER — Telehealth: Payer: Self-pay | Admitting: Gastroenterology

## 2018-01-10 ENCOUNTER — Encounter: Payer: Self-pay | Admitting: Gastroenterology

## 2018-01-10 NOTE — Telephone Encounter (Signed)
Pt left vm she had a procedure with Dr. Servando Snare yesterday and is experiencing lower back pain and would like to know if that is normal

## 2018-01-10 NOTE — Anesthesia Postprocedure Evaluation (Signed)
Anesthesia Post Note  Patient: Ashley Boyd  Procedure(s) Performed: COLONOSCOPY WITH PROPOFOL (N/A )  Patient location during evaluation: PACU Anesthesia Type: General Level of consciousness: awake and alert and oriented Pain management: pain level controlled Vital Signs Assessment: post-procedure vital signs reviewed and stable Respiratory status: spontaneous breathing Cardiovascular status: blood pressure returned to baseline Anesthetic complications: no     Last Vitals:  Vitals:   01/09/18 1126 01/09/18 1136  BP: (!) 141/85 (!) 164/79  Pulse: (!) 56 (!) 50  Resp: 19 20  Temp:    SpO2: 100% 100%    Last Pain:  Vitals:   01/09/18 1126  TempSrc:   PainSc: 0-No pain                 Ardis Fullwood

## 2018-01-16 ENCOUNTER — Ambulatory Visit
Admission: RE | Admit: 2018-01-16 | Discharge: 2018-01-16 | Disposition: A | Discharge status: Home / Self Care | Payer: Medicare Other | Source: Ambulatory Visit | Attending: Internal Medicine | Admitting: Internal Medicine

## 2018-01-16 DIAGNOSIS — Z1231 Encounter for screening mammogram for malignant neoplasm of breast: Secondary | ICD-10-CM | POA: Diagnosis not present

## 2018-01-18 DIAGNOSIS — Z0000 Encounter for general adult medical examination without abnormal findings: Secondary | ICD-10-CM | POA: Diagnosis not present

## 2018-01-19 DIAGNOSIS — R5381 Other malaise: Secondary | ICD-10-CM | POA: Diagnosis not present

## 2018-01-19 DIAGNOSIS — E7849 Other hyperlipidemia: Secondary | ICD-10-CM | POA: Diagnosis not present

## 2018-01-19 DIAGNOSIS — I10 Essential (primary) hypertension: Secondary | ICD-10-CM | POA: Diagnosis not present

## 2018-01-19 DIAGNOSIS — E034 Atrophy of thyroid (acquired): Secondary | ICD-10-CM | POA: Diagnosis not present

## 2018-01-22 DIAGNOSIS — L820 Inflamed seborrheic keratosis: Secondary | ICD-10-CM | POA: Diagnosis not present

## 2018-04-23 DIAGNOSIS — H18821 Corneal disorder due to contact lens, right eye: Secondary | ICD-10-CM | POA: Diagnosis not present

## 2018-12-03 ENCOUNTER — Other Ambulatory Visit: Payer: Self-pay

## 2018-12-03 ENCOUNTER — Encounter: Payer: Self-pay | Admitting: Podiatry

## 2018-12-03 ENCOUNTER — Ambulatory Visit (INDEPENDENT_AMBULATORY_CARE_PROVIDER_SITE_OTHER): Payer: Medicare Other

## 2018-12-03 ENCOUNTER — Ambulatory Visit: Payer: Medicare Other | Admitting: Podiatry

## 2018-12-03 VITALS — Temp 97.3°F

## 2018-12-03 DIAGNOSIS — M778 Other enthesopathies, not elsewhere classified: Secondary | ICD-10-CM

## 2018-12-03 DIAGNOSIS — M779 Enthesopathy, unspecified: Secondary | ICD-10-CM

## 2018-12-03 NOTE — Progress Notes (Signed)
She presents today chief complaint of painful forefoot bilaterally she states is been bothering her for the past 2 to 3 weeks states that it may have happened when she was doing yard work with her toes bent back for an extended period of time.  She denies any trauma.  She states that massaging and soaking has not really helped at all.  She does states they are approximately 50% better than when she initially called the office to make the appointment.  Objective: Vital signs are stable she is alert oriented x3 pulses are strongly palpable.  No open lesions or wounds.  She has medial drift of the second toe left foot at the metatarsal phalangeal joint right foot does not appear to be affected.  She still has pain on palpation and end range of motion of the second metatarsal phalangeal joints bilaterally.  Radiographs taken today demonstrate medial deviation of the second toe otherwise and elongated plantarflexed second metatarsal is noted bilaterally.  Assessment: Plantar flexed second metatarsal with capsulitis bilaterally with medial deviation of the second toe left.  No bunion deformity.  Plan: Discussed the need for oral anti-inflammatories topical anti-inflammatories as well as injectable anti-inflammatories she declines at this time we did discussed appropriate shoe gear stretching exercise ice therapy and shoe gear modifications.  I will follow-up with her in 3 weeks and if she is not improved dramatically we will inject her second interdigital space.

## 2018-12-06 ENCOUNTER — Telehealth: Payer: Self-pay

## 2018-12-06 MED ORDER — METHYLPREDNISOLONE 4 MG PO TABS: ORAL_TABLET | ORAL | 0 refills | Status: DC

## 2018-12-06 NOTE — Telephone Encounter (Signed)
Medication has been sent to pharmacy per Dr. Stephenie Acres verbal order

## 2018-12-06 NOTE — Telephone Encounter (Signed)
-----   Message from Arlyce Dice sent at 12/06/2018  1:53 PM EDT ----- Patient called in wanting to have "Medrol Dose Pack" prescription filled, and sent to [facility] listed on File.

## 2018-12-31 ENCOUNTER — Ambulatory Visit: Payer: Medicare Other | Admitting: Podiatry

## 2019-02-07 DIAGNOSIS — Z85828 Personal history of other malignant neoplasm of skin: Secondary | ICD-10-CM | POA: Diagnosis not present

## 2019-02-07 DIAGNOSIS — L578 Other skin changes due to chronic exposure to nonionizing radiation: Secondary | ICD-10-CM | POA: Diagnosis not present

## 2019-02-07 DIAGNOSIS — Z872 Personal history of diseases of the skin and subcutaneous tissue: Secondary | ICD-10-CM | POA: Diagnosis not present

## 2019-02-07 DIAGNOSIS — L821 Other seborrheic keratosis: Secondary | ICD-10-CM | POA: Diagnosis not present

## 2019-02-11 ENCOUNTER — Telehealth: Payer: Self-pay | Admitting: Podiatry

## 2019-02-13 NOTE — Telephone Encounter (Signed)
I would like my x-rays from my visit on 12/03/2018. I did not see a release form online but if you can, please e-mail the form to me at [email or web address] If you need to speak to me, I can be reached at [phone].

## 2019-08-12 ENCOUNTER — Other Ambulatory Visit: Payer: Self-pay | Admitting: Internal Medicine

## 2019-08-12 DIAGNOSIS — Z1231 Encounter for screening mammogram for malignant neoplasm of breast: Secondary | ICD-10-CM

## 2019-08-21 IMAGING — MG DIGITAL SCREENING BILATERAL MAMMOGRAM WITH TOMO AND CAD
8 series · 9 of 24 positions shown · non-contrast
Comparison: Previous exam(s).

CLINICAL DATA: Screening.

EXAM:
DIGITAL SCREENING BILATERAL MAMMOGRAM WITH TOMO AND CAD

[R MLO synth-2D]
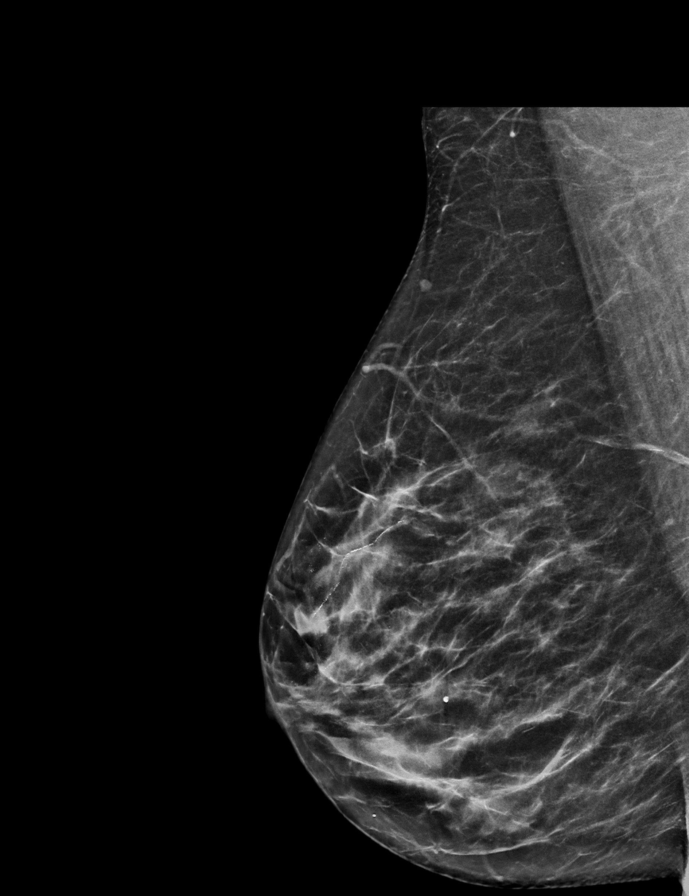

[L MLO synth-2D]
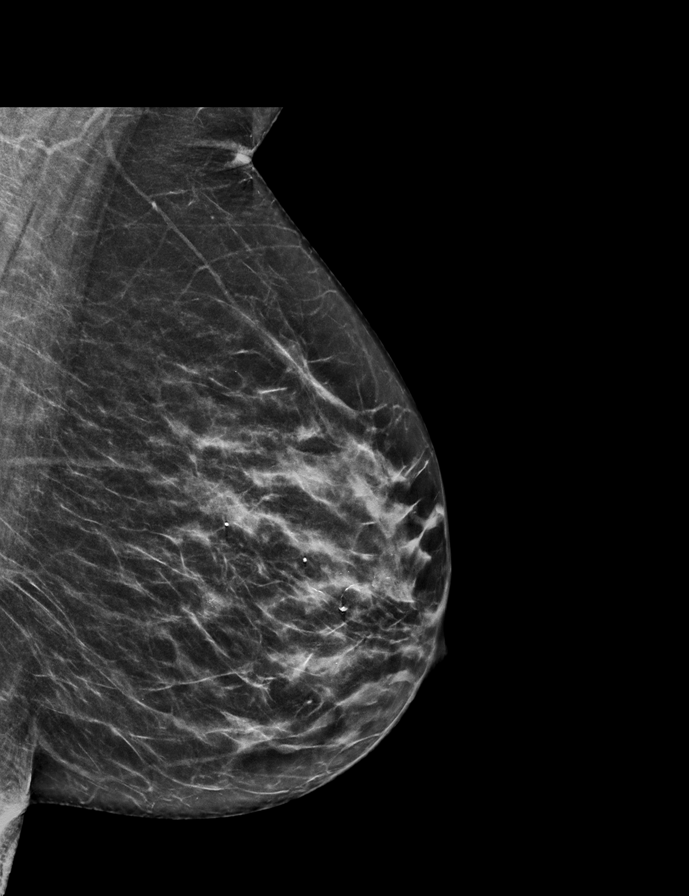

[R CC synth-2D]
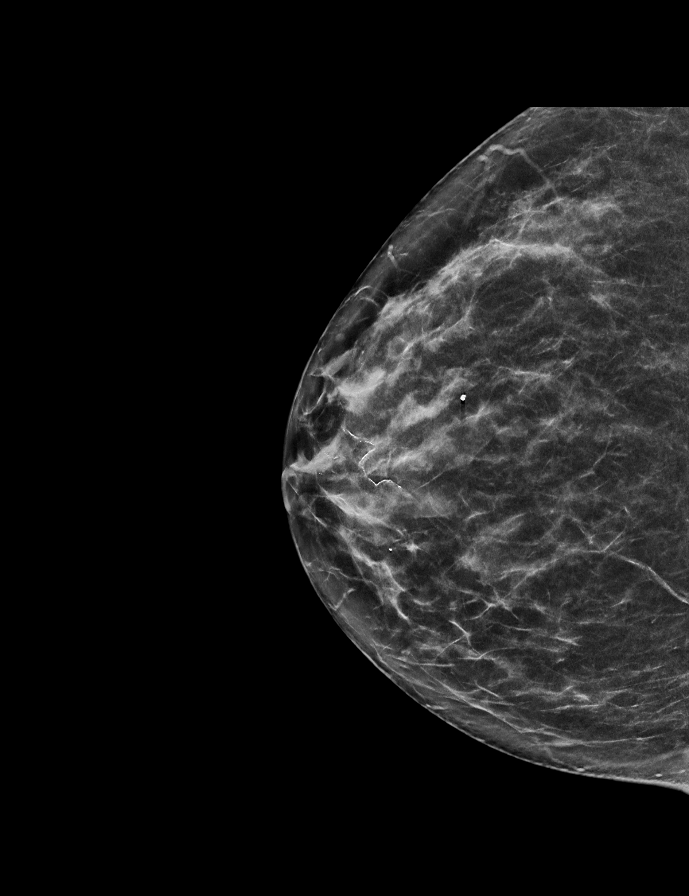

[L CC synth-2D]
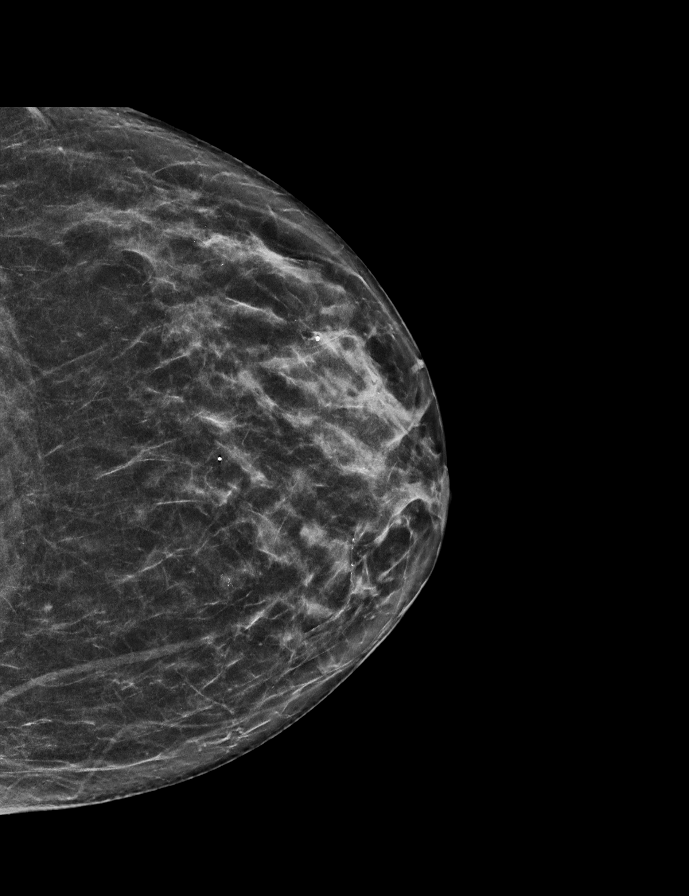

[R MLO tomo · 2 of 63 frames shown]
[frame 21/63]
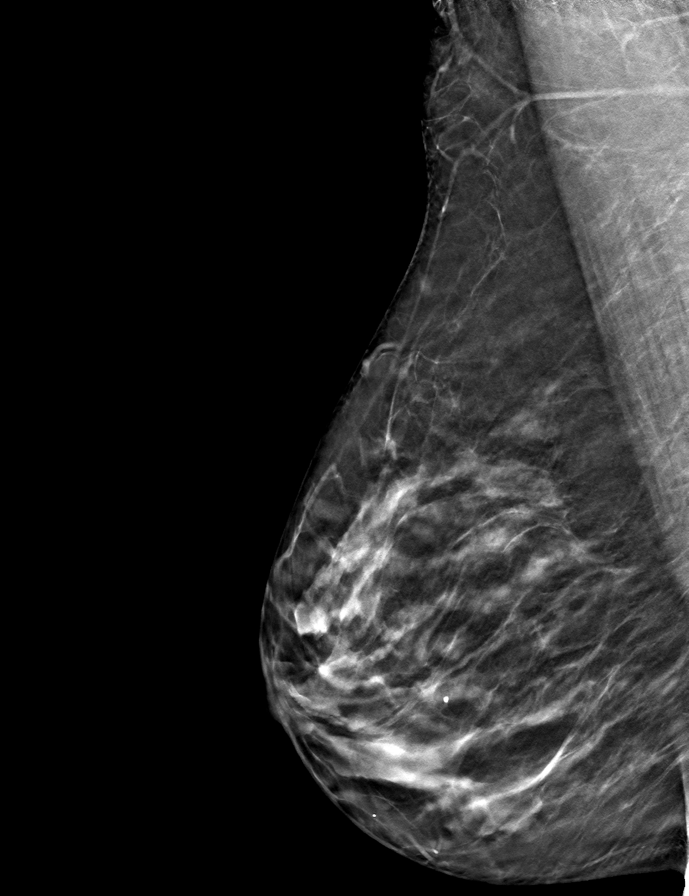
[frame 32/63]
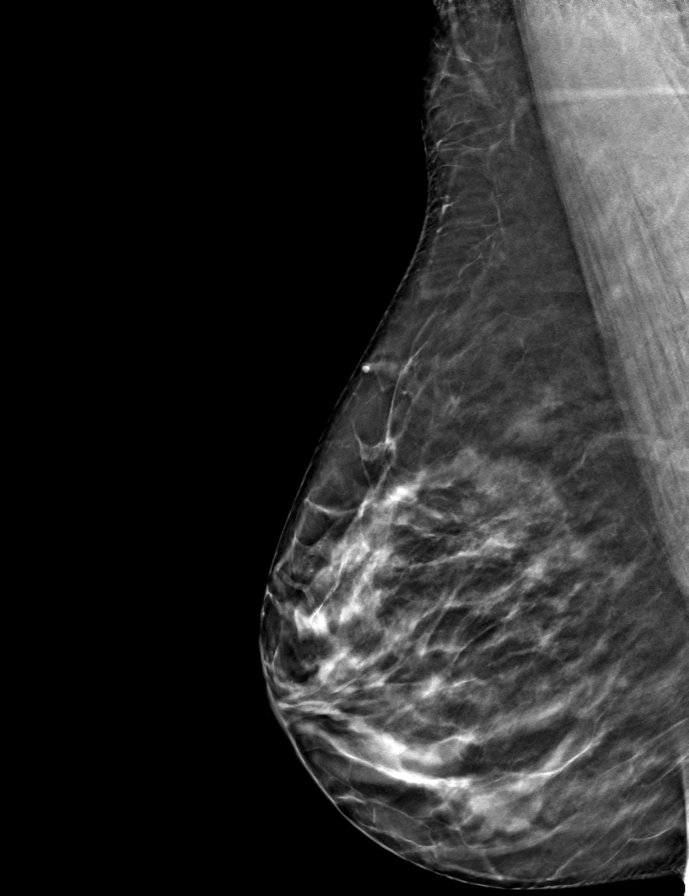

[L MLO tomo · tomo slice 30/59.0]
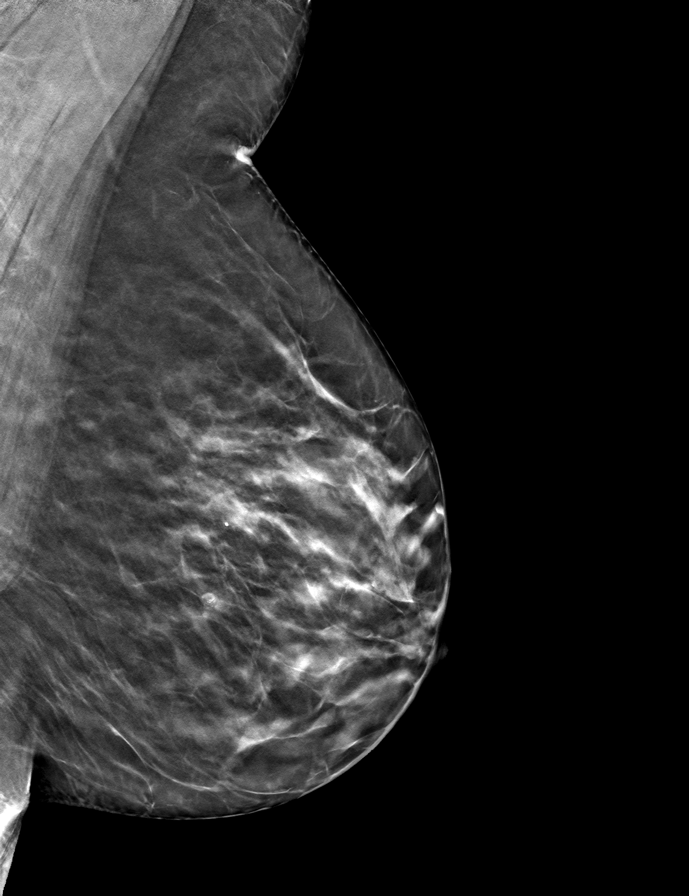

[R CC tomo · tomo slice 31/62.0]
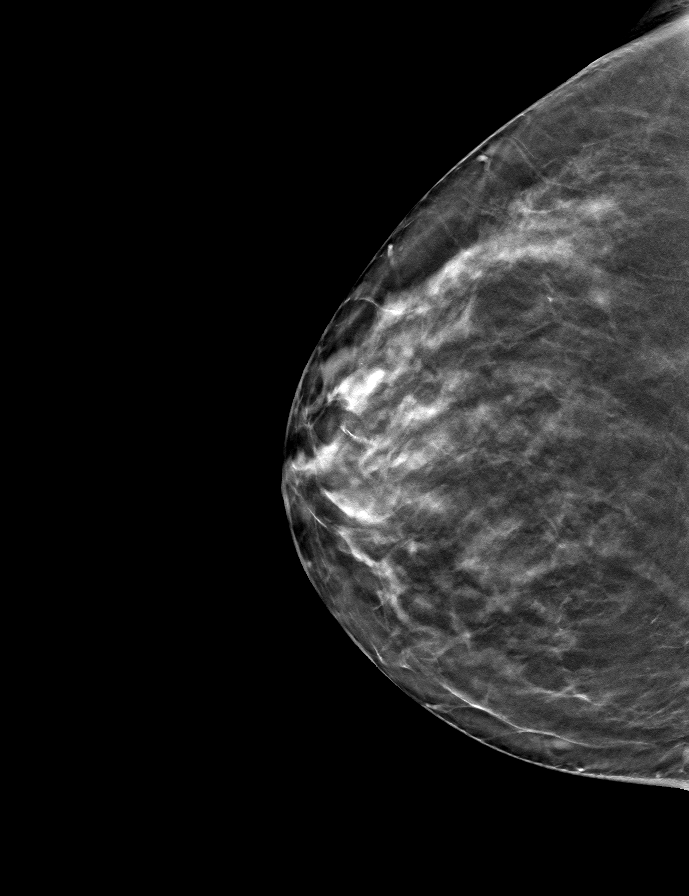

[L CC tomo · tomo slice 29/57.0]
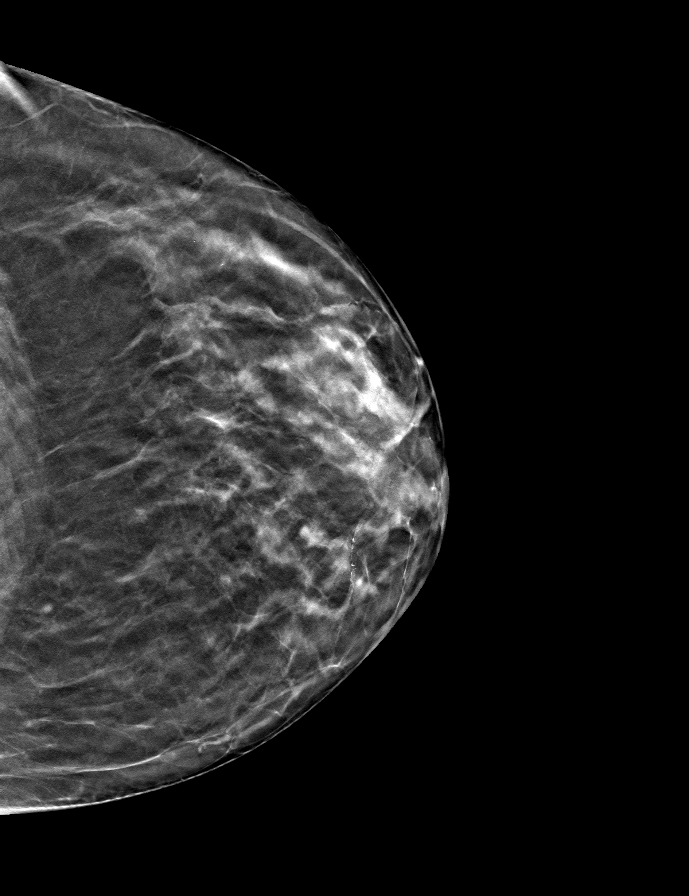

[9 of 24 positions shown; findings below may reference images not displayed]

ACR Breast Density Category c: The breast tissue is heterogeneously
dense, which may obscure small masses.
FINDINGS: There are no findings suspicious for malignancy. Images were
processed with CAD.
IMPRESSION: No mammographic evidence of malignancy. A result letter of this
screening mammogram will be mailed directly to the patient.

RECOMMENDATION:
Screening mammogram in one year. (Code:[id number]-[id number])

BI-RADS CATEGORY  1: Negative.

## 2019-12-18 DIAGNOSIS — M9903 Segmental and somatic dysfunction of lumbar region: Secondary | ICD-10-CM | POA: Diagnosis not present

## 2019-12-18 DIAGNOSIS — M9901 Segmental and somatic dysfunction of cervical region: Secondary | ICD-10-CM | POA: Diagnosis not present

## 2019-12-18 DIAGNOSIS — M4603 Spinal enthesopathy, cervicothoracic region: Secondary | ICD-10-CM | POA: Diagnosis not present

## 2019-12-18 DIAGNOSIS — M9904 Segmental and somatic dysfunction of sacral region: Secondary | ICD-10-CM | POA: Diagnosis not present

## 2019-12-18 DIAGNOSIS — M9902 Segmental and somatic dysfunction of thoracic region: Secondary | ICD-10-CM | POA: Diagnosis not present

## 2019-12-19 DIAGNOSIS — M4603 Spinal enthesopathy, cervicothoracic region: Secondary | ICD-10-CM | POA: Diagnosis not present

## 2019-12-19 DIAGNOSIS — M9903 Segmental and somatic dysfunction of lumbar region: Secondary | ICD-10-CM | POA: Diagnosis not present

## 2019-12-19 DIAGNOSIS — M9901 Segmental and somatic dysfunction of cervical region: Secondary | ICD-10-CM | POA: Diagnosis not present

## 2019-12-19 DIAGNOSIS — M9902 Segmental and somatic dysfunction of thoracic region: Secondary | ICD-10-CM | POA: Diagnosis not present

## 2019-12-19 DIAGNOSIS — M9904 Segmental and somatic dysfunction of sacral region: Secondary | ICD-10-CM | POA: Diagnosis not present

## 2019-12-20 ENCOUNTER — Ambulatory Visit: Payer: Medicare Other | Admitting: Podiatry

## 2019-12-20 ENCOUNTER — Ambulatory Visit: Payer: Medicare Other

## 2019-12-24 ENCOUNTER — Other Ambulatory Visit: Payer: Self-pay

## 2019-12-24 ENCOUNTER — Ambulatory Visit: Payer: Medicare Other | Admitting: Podiatry

## 2019-12-24 DIAGNOSIS — L600 Ingrowing nail: Secondary | ICD-10-CM | POA: Diagnosis not present

## 2019-12-24 NOTE — Progress Notes (Signed)
   Subjective: Patient presents today for evaluation of pain to the medial aspect of the left great toe. Patient is concerned for possible ingrown nail. Patient presents today for further treatment and evaluation.  Past Medical History:  Diagnosis Date  . Hypertension   . Obesity   . Vitamin D deficiency     Objective:  General: Well developed, nourished, in no acute distress, alert and oriented x3   Dermatology: Skin is warm, dry and supple bilateral. Medial border of the left great toenail appears to be mildly erythematous with evidence of an ingrowing nail. Pain on palpation noted to the border of the nail fold. The remaining nails appear unremarkable at this time. There are no open sores, lesions.  Vascular: Dorsalis Pedis artery and Posterior Tibial artery pedal pulses palpable. No lower extremity edema noted.   Neruologic: Grossly intact via light touch bilateral.  Musculoskeletal: Muscular strength within normal limits in all groups bilateral. Normal range of motion noted to all pedal and ankle joints.   Assesement: #1 Paronychia with ingrowing nail medial border of the left great toe #2 Pain in toe #3 Incurvated nail  Plan of Care:  1. Patient evaluated.  2. Patient requested not to have the nail avulsion procedure performed. Light debridement of the nail was performed using a nail nipper without incident or bleeding.  The offending border of the nail was debrided.  Hopefully this gives her some good relief 3.  Recommend good foot hygiene and regular routine nail debridement 4.  Return to clinic as needed  Felecia Shelling, DPM [facility]  Dr. Felecia Shelling, DPM    [address]                                        [place], [place] [address]                Office [phone]  Fax [phone]

## 2020-01-10 ENCOUNTER — Encounter: Payer: Medicare Other | Admitting: Internal Medicine

## 2020-01-13 DIAGNOSIS — M9904 Segmental and somatic dysfunction of sacral region: Secondary | ICD-10-CM | POA: Diagnosis not present

## 2020-01-13 DIAGNOSIS — M9903 Segmental and somatic dysfunction of lumbar region: Secondary | ICD-10-CM | POA: Diagnosis not present

## 2020-01-13 DIAGNOSIS — M9902 Segmental and somatic dysfunction of thoracic region: Secondary | ICD-10-CM | POA: Diagnosis not present

## 2020-01-13 DIAGNOSIS — M4603 Spinal enthesopathy, cervicothoracic region: Secondary | ICD-10-CM | POA: Diagnosis not present

## 2020-01-13 DIAGNOSIS — M9901 Segmental and somatic dysfunction of cervical region: Secondary | ICD-10-CM | POA: Diagnosis not present

## 2020-02-25 DIAGNOSIS — L821 Other seborrheic keratosis: Secondary | ICD-10-CM | POA: Diagnosis not present

## 2020-02-25 DIAGNOSIS — L570 Actinic keratosis: Secondary | ICD-10-CM | POA: Diagnosis not present

## 2020-02-25 DIAGNOSIS — L578 Other skin changes due to chronic exposure to nonionizing radiation: Secondary | ICD-10-CM | POA: Diagnosis not present

## 2020-02-25 DIAGNOSIS — Z85828 Personal history of other malignant neoplasm of skin: Secondary | ICD-10-CM | POA: Diagnosis not present

## 2020-02-25 DIAGNOSIS — Z872 Personal history of diseases of the skin and subcutaneous tissue: Secondary | ICD-10-CM | POA: Diagnosis not present

## 2020-11-09 ENCOUNTER — Ambulatory Visit: Payer: Self-pay | Admitting: Internal Medicine

## 2021-02-24 DIAGNOSIS — L578 Other skin changes due to chronic exposure to nonionizing radiation: Secondary | ICD-10-CM | POA: Diagnosis not present

## 2021-02-24 DIAGNOSIS — Z85828 Personal history of other malignant neoplasm of skin: Secondary | ICD-10-CM | POA: Diagnosis not present

## 2021-02-24 DIAGNOSIS — Z872 Personal history of diseases of the skin and subcutaneous tissue: Secondary | ICD-10-CM | POA: Diagnosis not present

## 2021-04-15 ENCOUNTER — Ambulatory Visit: Payer: Medicare Other

## 2021-06-30 DIAGNOSIS — L821 Other seborrheic keratosis: Secondary | ICD-10-CM | POA: Diagnosis not present

## 2021-12-31 DIAGNOSIS — H527 Unspecified disorder of refraction: Secondary | ICD-10-CM | POA: Diagnosis not present

## 2022-02-24 DIAGNOSIS — Z872 Personal history of diseases of the skin and subcutaneous tissue: Secondary | ICD-10-CM | POA: Diagnosis not present

## 2022-02-24 DIAGNOSIS — L570 Actinic keratosis: Secondary | ICD-10-CM | POA: Diagnosis not present

## 2022-02-24 DIAGNOSIS — Z85828 Personal history of other malignant neoplasm of skin: Secondary | ICD-10-CM | POA: Diagnosis not present

## 2022-02-24 DIAGNOSIS — L821 Other seborrheic keratosis: Secondary | ICD-10-CM | POA: Diagnosis not present

## 2022-02-24 DIAGNOSIS — L578 Other skin changes due to chronic exposure to nonionizing radiation: Secondary | ICD-10-CM | POA: Diagnosis not present

## 2022-03-01 DIAGNOSIS — H905 Unspecified sensorineural hearing loss: Secondary | ICD-10-CM | POA: Diagnosis not present

## 2022-08-31 DIAGNOSIS — L821 Other seborrheic keratosis: Secondary | ICD-10-CM | POA: Diagnosis not present

## 2022-08-31 DIAGNOSIS — L570 Actinic keratosis: Secondary | ICD-10-CM | POA: Diagnosis not present

## 2023-03-02 ENCOUNTER — Ambulatory Visit: Payer: Medicare Other | Admitting: Nurse Practitioner

## 2023-07-04 DIAGNOSIS — K080 Exfoliation of teeth due to systemic causes: Secondary | ICD-10-CM | POA: Diagnosis not present

## 2023-11-27 DIAGNOSIS — H02831 Dermatochalasis of right upper eyelid: Secondary | ICD-10-CM | POA: Diagnosis not present

## 2023-11-27 DIAGNOSIS — H18453 Nodular corneal degeneration, bilateral: Secondary | ICD-10-CM | POA: Diagnosis not present

## 2023-11-27 DIAGNOSIS — H2513 Age-related nuclear cataract, bilateral: Secondary | ICD-10-CM | POA: Diagnosis not present

## 2023-11-27 DIAGNOSIS — H02834 Dermatochalasis of left upper eyelid: Secondary | ICD-10-CM | POA: Diagnosis not present

## 2023-12-19 DIAGNOSIS — L821 Other seborrheic keratosis: Secondary | ICD-10-CM | POA: Diagnosis not present

## 2024-01-18 ENCOUNTER — Ambulatory Visit (INDEPENDENT_AMBULATORY_CARE_PROVIDER_SITE_OTHER): Admitting: Internal Medicine

## 2024-01-18 ENCOUNTER — Encounter: Payer: Self-pay | Admitting: Internal Medicine

## 2024-01-18 VITALS — BP 135/88 | Ht 65.0 in | Wt 170.0 lb

## 2024-01-18 DIAGNOSIS — Z6828 Body mass index (BMI) 28.0-28.9, adult: Secondary | ICD-10-CM | POA: Insufficient documentation

## 2024-01-18 DIAGNOSIS — I10 Essential (primary) hypertension: Secondary | ICD-10-CM | POA: Diagnosis not present

## 2024-01-18 DIAGNOSIS — R7303 Prediabetes: Secondary | ICD-10-CM | POA: Diagnosis not present

## 2024-01-18 DIAGNOSIS — E663 Overweight: Secondary | ICD-10-CM

## 2024-01-18 DIAGNOSIS — E7800 Pure hypercholesterolemia, unspecified: Secondary | ICD-10-CM

## 2024-01-18 NOTE — Assessment & Plan Note (Signed)
Encouraged low carb diet

## 2024-01-18 NOTE — Progress Notes (Signed)
 Subjective:    Patient ID: Ashley Boyd, female    DOB: 05/08/1950, 73 y.o.   MRN: [id number]  HPI  Patient presents to clinic today to establish care and for management of the conditions listed below.  HLD: Her last LDL was 113, triglycerides 86, 01/2024.  She is not taking any cholesterol lowering She tries to consume low-fat diet.  Prediabetes: Her last A1c was 6%, 01/2024.  She is not taking any oral diabetic medication at this time.  She does not check her sugars.  HTN: Her BP today is 152/88. Her BP was 135/88 at home. She is not taking any antihypertensive medications at this time. ECG from 01/2016 reviewed.  Review of Systems   Past Medical History:  Diagnosis Date   Hypertension    Obesity    Vitamin D  deficiency     Current Outpatient Medications  Medication Sig Dispense Refill   methylPREDNISolone  (MEDROL ) 4 MG tablet Take as directed 21 tablet 0   No current facility-administered medications for this visit.    No Known Allergies  Family History  Problem Relation Age of Onset   Emphysema Father    Lung cancer Father    Hypertension Mother    Kidney disease Mother    Diabetes Neg Hx    Coronary artery disease Neg Hx    Stroke Neg Hx     Social History   Socioeconomic History   Marital status: Single    Spouse name: Not on file   Number of children: Not on file   Years of education: Not on file   Highest education level: 12th grade  Occupational History   Not on file  Tobacco Use   Smoking status: Never   Smokeless tobacco: Never  Vaping Use   Vaping status: Never Used  Substance and Sexual Activity   Alcohol use: No   Drug use: No   Sexual activity: Never  Other Topics Concern   Not on file  Social History Narrative   Caffeine: soda 3x/wk   Lives with mother, grown son   Occupation: accounts Environmental health practitioner   Edu: HS   Activity: no regular activity   Diet: good water, fruits/vegetables occasional   Social Drivers of Manufacturing engineer Strain: Low Risk  (01/17/2024)   Overall Financial Resource Strain (CARDIA)    Difficulty of Paying Living Expenses: Not hard at all  Food Insecurity: No Food Insecurity (01/17/2024)   Hunger Vital Sign    Worried About Running Out of Food in the Last Year: Never true    Ran Out of Food in the Last Year: Never true  Transportation Needs: Unknown (01/17/2024)   PRAPARE - Administrator, Civil Service (Medical): No    Lack of Transportation (Non-Medical): Not on file  Physical Activity: Sufficiently Active (01/17/2024)   Exercise Vital Sign    Days of Exercise per Week: 3 days    Minutes of Exercise per Session: 50 min  Stress: No Stress Concern Present (01/17/2024)   [employer] of Occupational Health - Occupational Stress Questionnaire    Feeling of Stress: Not at all  Social Connections: Moderately Integrated (01/17/2024)   Social Connection and Isolation Panel    Frequency of Communication with Friends and Family: More than three times a week    Frequency of Social Gatherings with Friends and Family: More than three times a week    Attends Religious Services: More than 4 times per year  Active Member of Clubs or Organizations: Yes    Attends Banker Meetings: More than 4 times per year    Marital Status: Divorced  Catering manager Violence: Not on file     Constitutional: Denies fever, malaise, fatigue, headache or abrupt weight changes.  HEENT: Denies eye pain, eye redness, ear pain, ringing in the ears, wax buildup, runny nose, nasal congestion, bloody nose, or sore throat. Respiratory: Denies difficulty breathing, shortness of breath, cough or sputum production.   Cardiovascular: Denies chest pain, chest tightness, palpitations or swelling in the hands or feet.  Gastrointestinal: Denies abdominal pain, bloating, constipation, diarrhea or blood in the stool.  GU: Denies urgency, frequency, pain with urination, burning sensation,  blood in urine, odor or discharge. Musculoskeletal: Denies decrease in range of motion, difficulty with gait, muscle pain or joint pain and swelling.  Skin: Denies redness, rashes, lesions or ulcercations.  Neurological: Denies dizziness, difficulty with memory, difficulty with speech or problems with balance and coordination.  Psych: Denies anxiety, depression, SI/HI.  No other specific complaints in a complete review of systems (except as listed in HPI above).      Objective:   Physical Exam BP 135/88 Comment: home reading this morning  Ht 5' 5 (1.651 m)   Wt 170 lb (77.1 kg)   BMI 28.29 kg/m   Wt Readings from Last 3 Encounters:  01/09/18 160 lb (72.6 kg)  03/18/16 196 lb (88.9 kg)  02/16/16 200 lb (90.7 kg)    General: Appears her stated age, overweight, in NAD. Skin: Warm, dry and intact.  HEENT: Head: normal shape and size; Eyes: sclera white, no icterus, conjunctiva pink, PERRLA and EOMs intact;  Cardiovascular: Normal rate and rhythm. S1,S2 noted.  No murmur, rubs or gallops noted. No JVD or BLE edema. No carotid bruits noted. Pulmonary/Chest: Normal effort and positive vesicular breath sounds. No respiratory distress. No wheezes, rales or ronchi noted.  Musculoskeletal: No difficulty with gait.  Neurological: Alert and oriented. Coordination normal.  Psychiatric: Mood and affect normal. Behavior is normal. Judgment and thought content normal.    BMET    Component Value Date/Time   NA 142 03/15/2016 0708   K 4.7 03/15/2016 0708   K 4.7 12/27/2011 0000   CL 100 03/15/2016 0708   CL 102 12/27/2011 0000   CO2 26 03/15/2016 0708   CO2 22 12/27/2011 0000   GLUCOSE 97 03/15/2016 0708   GLUCOSE 116 (H) 04/05/2012 1313   BUN 15 03/15/2016 0708   CREATININE 0.97 03/15/2016 0708   CREATININE 0.99 12/27/2011 0000   CALCIUM 9.7 03/15/2016 0708   CALCIUM 9.2 12/27/2011 0000   GFRNONAA 62 03/15/2016 0708   GFRAA 72 03/15/2016 0708    Lipid Panel     Component Value  Date/Time   CHOL 211 (H) 03/15/2016 0708   CHOL 238 04/30/2012 0000   TRIG 127 03/15/2016 0708   TRIG 98 04/30/2012 0000   TRIG 104 12/27/2011 0000   HDL 49 03/15/2016 0708   LDLCALC 137 (H) 03/15/2016 0708    CBC    Component Value Date/Time   WBC 6.2 03/15/2016 0708   WBC 15.1 (H) 04/05/2012 1313   RBC 4.53 03/15/2016 0708   RBC 4.98 04/05/2012 1313   HGB 13.3 03/15/2016 0708   HCT 40.2 03/15/2016 0708   HCT 41 12/27/2011 0000   PLT 338 03/15/2016 0708   MCV 89 03/15/2016 0708   MCH 29.4 03/15/2016 0708   MCHC 33.1 03/15/2016 0708   MCHC  32.7 04/05/2012 1313   RDW 13.1 03/15/2016 0708   LYMPHSABS 2.6 04/05/2012 1313   MONOABS 1.0 04/05/2012 1313   EOSABS 0.2 04/05/2012 1313   BASOSABS 0.0 04/05/2012 1313    Hgb A1C Lab Results  Component Value Date   HGBA1C 5.8 (H) 03/15/2016            Assessment & Plan:    RTC 6 months, for your annual exam Angeline Laura, NP

## 2024-01-18 NOTE — Assessment & Plan Note (Signed)
 Encouraged diet and exercise for weight loss ?

## 2024-01-18 NOTE — Assessment & Plan Note (Signed)
 She declines any antihypertensive medication at this time She understands the risk of increased risk of heart attack, stroke and death Reinforced DASH diet and exercise for weight loss Will monitor

## 2024-01-18 NOTE — Assessment & Plan Note (Signed)
 She declines cholesterol-lowering medication Encouraged her to consume a low-fat diet

## 2024-01-18 NOTE — Patient Instructions (Signed)

## 2024-02-27 DIAGNOSIS — L821 Other seborrheic keratosis: Secondary | ICD-10-CM | POA: Diagnosis not present

## 2024-02-27 DIAGNOSIS — L578 Other skin changes due to chronic exposure to nonionizing radiation: Secondary | ICD-10-CM | POA: Diagnosis not present

## 2024-02-27 DIAGNOSIS — Z872 Personal history of diseases of the skin and subcutaneous tissue: Secondary | ICD-10-CM | POA: Diagnosis not present

## 2024-02-27 DIAGNOSIS — Z85828 Personal history of other malignant neoplasm of skin: Secondary | ICD-10-CM | POA: Diagnosis not present

## 2024-03-25 ENCOUNTER — Ambulatory Visit: Payer: Self-pay | Admitting: Family Medicine

## 2024-07-18 ENCOUNTER — Encounter: Admitting: Internal Medicine
